# Patient Record
Sex: Female | Born: 1950 | Race: White | Hispanic: No | Marital: Married | Smoking: Never smoker
Health system: Southern US, Community
[De-identification: ages and names within clinical notes are randomized; demographics above are authoritative.]

## PROBLEM LIST (undated history)

## (undated) DIAGNOSIS — K760 Fatty (change of) liver, not elsewhere classified: Secondary | ICD-10-CM

## (undated) DIAGNOSIS — M542 Cervicalgia: Secondary | ICD-10-CM

## (undated) DIAGNOSIS — M51369 Other intervertebral disc degeneration, lumbar region without mention of lumbar back pain or lower extremity pain: Secondary | ICD-10-CM

## (undated) DIAGNOSIS — T884XXA Failed or difficult intubation, initial encounter: Secondary | ICD-10-CM

## (undated) DIAGNOSIS — J302 Other seasonal allergic rhinitis: Secondary | ICD-10-CM

## (undated) DIAGNOSIS — H409 Unspecified glaucoma: Secondary | ICD-10-CM

## (undated) DIAGNOSIS — F32A Depression, unspecified: Secondary | ICD-10-CM

## (undated) DIAGNOSIS — I10 Essential (primary) hypertension: Secondary | ICD-10-CM

## (undated) DIAGNOSIS — H25019 Cortical age-related cataract, unspecified eye: Secondary | ICD-10-CM

## (undated) DIAGNOSIS — K219 Gastro-esophageal reflux disease without esophagitis: Secondary | ICD-10-CM

## (undated) DIAGNOSIS — G4710 Hypersomnia, unspecified: Secondary | ICD-10-CM

## (undated) DIAGNOSIS — G44229 Chronic tension-type headache, not intractable: Secondary | ICD-10-CM

## (undated) DIAGNOSIS — E785 Hyperlipidemia, unspecified: Secondary | ICD-10-CM

## (undated) DIAGNOSIS — E119 Type 2 diabetes mellitus without complications: Secondary | ICD-10-CM

## (undated) DIAGNOSIS — K469 Unspecified abdominal hernia without obstruction or gangrene: Secondary | ICD-10-CM

## (undated) DIAGNOSIS — M5136 Other intervertebral disc degeneration, lumbar region: Secondary | ICD-10-CM

## (undated) DIAGNOSIS — G4730 Sleep apnea, unspecified: Secondary | ICD-10-CM

## (undated) DIAGNOSIS — C801 Malignant (primary) neoplasm, unspecified: Secondary | ICD-10-CM

## (undated) DIAGNOSIS — Z8619 Personal history of other infectious and parasitic diseases: Secondary | ICD-10-CM

## (undated) DIAGNOSIS — T4145XA Adverse effect of unspecified anesthetic, initial encounter: Secondary | ICD-10-CM

## (undated) DIAGNOSIS — T8859XA Other complications of anesthesia, initial encounter: Secondary | ICD-10-CM

## (undated) DIAGNOSIS — D649 Anemia, unspecified: Secondary | ICD-10-CM

## (undated) DIAGNOSIS — M545 Low back pain, unspecified: Secondary | ICD-10-CM

## (undated) DIAGNOSIS — M1990 Unspecified osteoarthritis, unspecified site: Secondary | ICD-10-CM

## (undated) DIAGNOSIS — I739 Peripheral vascular disease, unspecified: Secondary | ICD-10-CM

## (undated) DIAGNOSIS — F329 Major depressive disorder, single episode, unspecified: Secondary | ICD-10-CM

## (undated) DIAGNOSIS — F419 Anxiety disorder, unspecified: Secondary | ICD-10-CM

## (undated) HISTORY — PX: CARPAL TUNNEL RELEASE: SHX101

## (undated) HISTORY — PX: ESOPHAGOGASTRODUODENOSCOPY: SHX1529

## (undated) HISTORY — PX: APPENDECTOMY: SHX54

## (undated) HISTORY — PX: JOINT REPLACEMENT: SHX530

## (undated) HISTORY — PX: HERNIA REPAIR: SHX51

## (undated) HISTORY — PX: OTHER SURGICAL HISTORY: SHX169

## (undated) HISTORY — PX: CATARACT EXTRACTION, BILATERAL: SHX1313

## (undated) HISTORY — PX: COLONOSCOPY: SHX174

## (undated) HISTORY — PX: CHOLECYSTECTOMY: SHX55

## (undated) HISTORY — PX: EYE SURGERY: SHX253

---

## 2004-02-23 ENCOUNTER — Other Ambulatory Visit: Payer: Self-pay

## 2004-07-23 ENCOUNTER — Ambulatory Visit: Payer: Self-pay

## 2004-08-12 ENCOUNTER — Ambulatory Visit: Payer: Self-pay

## 2005-01-20 ENCOUNTER — Ambulatory Visit: Payer: Self-pay | Admitting: Unknown Physician Specialty

## 2005-08-06 ENCOUNTER — Other Ambulatory Visit: Payer: Self-pay

## 2005-08-08 ENCOUNTER — Ambulatory Visit: Payer: Self-pay | Admitting: Surgery

## 2005-08-11 ENCOUNTER — Ambulatory Visit: Payer: Self-pay

## 2006-06-24 ENCOUNTER — Ambulatory Visit: Payer: Self-pay | Admitting: Psychiatry

## 2006-08-20 ENCOUNTER — Ambulatory Visit: Payer: Self-pay

## 2006-11-16 ENCOUNTER — Ambulatory Visit: Payer: Self-pay | Admitting: Specialist

## 2007-03-03 ENCOUNTER — Other Ambulatory Visit: Payer: Self-pay

## 2007-03-03 ENCOUNTER — Ambulatory Visit: Payer: Self-pay | Admitting: Ophthalmology

## 2007-03-23 ENCOUNTER — Ambulatory Visit: Payer: Self-pay | Admitting: Ophthalmology

## 2007-09-01 ENCOUNTER — Ambulatory Visit: Payer: Self-pay

## 2008-09-11 ENCOUNTER — Ambulatory Visit: Payer: Self-pay

## 2009-10-16 ENCOUNTER — Ambulatory Visit: Payer: Self-pay

## 2010-12-13 ENCOUNTER — Ambulatory Visit: Payer: Self-pay | Admitting: Internal Medicine

## 2011-01-07 ENCOUNTER — Ambulatory Visit: Payer: Self-pay

## 2012-03-09 ENCOUNTER — Ambulatory Visit: Payer: Self-pay

## 2012-11-15 ENCOUNTER — Ambulatory Visit: Payer: Self-pay | Admitting: Unknown Physician Specialty

## 2012-11-17 LAB — PATHOLOGY REPORT

## 2013-06-24 ENCOUNTER — Ambulatory Visit: Payer: Self-pay | Admitting: Specialist

## 2013-12-09 DIAGNOSIS — M5416 Radiculopathy, lumbar region: Secondary | ICD-10-CM | POA: Insufficient documentation

## 2013-12-19 ENCOUNTER — Ambulatory Visit: Payer: Self-pay | Admitting: General Practice

## 2013-12-28 DIAGNOSIS — M5136 Other intervertebral disc degeneration, lumbar region: Secondary | ICD-10-CM | POA: Insufficient documentation

## 2014-02-23 ENCOUNTER — Ambulatory Visit: Payer: Self-pay

## 2014-02-23 ENCOUNTER — Emergency Department: Payer: Self-pay | Admitting: Emergency Medicine

## 2014-02-23 LAB — URINALYSIS, COMPLETE
BACTERIA: NONE SEEN
BILIRUBIN, UR: NEGATIVE
Blood: NEGATIVE
Glucose,UR: NEGATIVE mg/dL (ref 0–75)
Ketone: NEGATIVE
LEUKOCYTE ESTERASE: NEGATIVE
NITRITE: NEGATIVE
PROTEIN: NEGATIVE
Ph: 5 (ref 4.5–8.0)
RBC,UR: 1 /HPF (ref 0–5)
Specific Gravity: 1.029 (ref 1.003–1.030)
WBC UR: <1 /HPF (ref 0–5)

## 2014-02-23 LAB — COMPREHENSIVE METABOLIC PANEL
AST: 31 U/L (ref 15–37)
Albumin: 3.4 g/dL (ref 3.4–5.0)
Alkaline Phosphatase: 70 U/L
Anion Gap: 7 (ref 7–16)
BILIRUBIN TOTAL: 0.4 mg/dL (ref 0.2–1.0)
BUN: 14 mg/dL (ref 7–18)
Calcium, Total: 8.6 mg/dL (ref 8.5–10.1)
Chloride: 105 mmol/L (ref 98–107)
Co2: 28 mmol/L (ref 21–32)
Creatinine: 0.8 mg/dL (ref 0.60–1.30)
EGFR (African American): >60
EGFR (Non-African Amer.): >60
GLUCOSE: 141 mg/dL — AB (ref 65–99)
OSMOLALITY: 282 (ref 275–301)
Potassium: 4.1 mmol/L (ref 3.5–5.1)
SGPT (ALT): 40 U/L
Sodium: 140 mmol/L (ref 136–145)
Total Protein: 7.1 g/dL (ref 6.4–8.2)

## 2014-02-23 LAB — CBC
HCT: 40.3 % (ref 35.0–47.0)
HGB: 13.1 g/dL (ref 12.0–16.0)
MCH: 28.8 pg (ref 26.0–34.0)
MCHC: 32.6 g/dL (ref 32.0–36.0)
MCV: 88 fL (ref 80–100)
Platelet: 131 10*3/uL — ABNORMAL LOW (ref 150–440)
RBC: 4.55 10*6/uL (ref 3.80–5.20)
RDW: 13.7 % (ref 11.5–14.5)
WBC: 4.7 10*3/uL (ref 3.6–11.0)

## 2014-02-23 LAB — CK TOTAL AND CKMB (NOT AT ARMC)
CK, Total: 117 U/L
CK-MB: 3.8 ng/mL — ABNORMAL HIGH (ref 0.5–3.6)

## 2014-02-23 LAB — TROPONIN I
Troponin-I: <0.02 ng/mL
Troponin-I: <0.02 ng/mL

## 2014-03-15 ENCOUNTER — Ambulatory Visit: Payer: Self-pay | Admitting: General Practice

## 2014-03-15 LAB — CBC
HCT: 41.3 % (ref 35.0–47.0)
HGB: 13.1 g/dL (ref 12.0–16.0)
MCH: 28.1 pg (ref 26.0–34.0)
MCHC: 31.7 g/dL — ABNORMAL LOW (ref 32.0–36.0)
MCV: 89 fL (ref 80–100)
Platelet: 184 10*3/uL (ref 150–440)
RBC: 4.67 10*6/uL (ref 3.80–5.20)
RDW: 13.6 % (ref 11.5–14.5)
WBC: 5.5 10*3/uL (ref 3.6–11.0)

## 2014-03-15 LAB — URINALYSIS, COMPLETE
BLOOD: NEGATIVE
Bacteria: NONE SEEN
Bilirubin,UR: NEGATIVE
Glucose,UR: NEGATIVE mg/dL (ref 0–75)
Ketone: NEGATIVE
Leukocyte Esterase: NEGATIVE
Nitrite: NEGATIVE
PH: 5 (ref 4.5–8.0)
PROTEIN: NEGATIVE
RBC,UR: 1 /HPF (ref 0–5)
SPECIFIC GRAVITY: 1.019 (ref 1.003–1.030)
Squamous Epithelial: <1
WBC UR: 1 /HPF (ref 0–5)

## 2014-03-15 LAB — BASIC METABOLIC PANEL
ANION GAP: 6 — AB (ref 7–16)
BUN: 14 mg/dL (ref 7–18)
CALCIUM: 9 mg/dL (ref 8.5–10.1)
CREATININE: 0.69 mg/dL (ref 0.60–1.30)
Chloride: 103 mmol/L (ref 98–107)
Co2: 32 mmol/L (ref 21–32)
EGFR (African American): >60
EGFR (Non-African Amer.): >60
Glucose: 87 mg/dL (ref 65–99)
OSMOLALITY: 281 (ref 275–301)
POTASSIUM: 3.6 mmol/L (ref 3.5–5.1)
SODIUM: 141 mmol/L (ref 136–145)

## 2014-03-15 LAB — PROTIME-INR
INR: 1
Prothrombin Time: 13.5 secs (ref 11.5–14.7)

## 2014-03-15 LAB — SEDIMENTATION RATE: Erythrocyte Sed Rate: 11 mm/hr (ref 0–30)

## 2014-03-15 LAB — APTT: Activated PTT: 24.4 secs (ref 23.6–35.9)

## 2014-03-15 LAB — MRSA PCR SCREENING

## 2014-03-17 LAB — URINE CULTURE

## 2014-03-29 ENCOUNTER — Inpatient Hospital Stay: Payer: Self-pay | Admitting: General Practice

## 2014-03-30 LAB — BASIC METABOLIC PANEL
Anion Gap: 6 — ABNORMAL LOW (ref 7–16)
BUN: 9 mg/dL (ref 7–18)
CHLORIDE: 108 mmol/L — AB (ref 98–107)
CO2: 26 mmol/L (ref 21–32)
CREATININE: 0.64 mg/dL (ref 0.60–1.30)
Calcium, Total: 7.6 mg/dL — ABNORMAL LOW (ref 8.5–10.1)
EGFR (African American): >60
EGFR (Non-African Amer.): >60
Glucose: 130 mg/dL — ABNORMAL HIGH (ref 65–99)
Osmolality: 280 (ref 275–301)
POTASSIUM: 3.7 mmol/L (ref 3.5–5.1)
SODIUM: 140 mmol/L (ref 136–145)

## 2014-03-30 LAB — PLATELET COUNT: PLATELETS: 138 10*3/uL — AB (ref 150–440)

## 2014-03-30 LAB — HEMOGLOBIN: HGB: 10.5 g/dL — ABNORMAL LOW (ref 12.0–16.0)

## 2014-03-31 LAB — BASIC METABOLIC PANEL
ANION GAP: 8 (ref 7–16)
BUN: 7 mg/dL (ref 7–18)
CO2: 28 mmol/L (ref 21–32)
CREATININE: 0.55 mg/dL — AB (ref 0.60–1.30)
Calcium, Total: 7.7 mg/dL — ABNORMAL LOW (ref 8.5–10.1)
Chloride: 104 mmol/L (ref 98–107)
EGFR (Non-African Amer.): >60
GLUCOSE: 54 mg/dL — AB (ref 65–99)
Osmolality: 275 (ref 275–301)
Potassium: 3.5 mmol/L (ref 3.5–5.1)
SODIUM: 140 mmol/L (ref 136–145)

## 2014-03-31 LAB — PLATELET COUNT: PLATELETS: 141 10*3/uL — AB (ref 150–440)

## 2014-03-31 LAB — URINALYSIS, COMPLETE
BACTERIA: NONE SEEN
Bilirubin,UR: NEGATIVE
Blood: NEGATIVE
GLUCOSE, UR: NEGATIVE mg/dL (ref 0–75)
Ketone: NEGATIVE
Leukocyte Esterase: NEGATIVE
NITRITE: NEGATIVE
Ph: 6 (ref 4.5–8.0)
Protein: NEGATIVE
Specific Gravity: 1.004 (ref 1.003–1.030)

## 2014-03-31 LAB — HEMOGLOBIN: HGB: 10.2 g/dL — ABNORMAL LOW (ref 12.0–16.0)

## 2014-04-01 LAB — CBC WITH DIFFERENTIAL/PLATELET
Basophil #: 0 10*3/uL (ref 0.0–0.1)
Basophil %: 0.5 %
Eosinophil #: 0.1 10*3/uL (ref 0.0–0.7)
Eosinophil %: 1.5 %
HCT: 31.1 % — AB (ref 35.0–47.0)
HGB: 10.4 g/dL — AB (ref 12.0–16.0)
LYMPHS ABS: 1 10*3/uL (ref 1.0–3.6)
Lymphocyte %: 12.4 %
MCH: 29.4 pg (ref 26.0–34.0)
MCHC: 33.5 g/dL (ref 32.0–36.0)
MCV: 88 fL (ref 80–100)
Monocyte #: 0.8 x10 3/mm (ref 0.2–0.9)
Monocyte %: 10.1 %
NEUTROS ABS: 6.1 10*3/uL (ref 1.4–6.5)
Neutrophil %: 75.5 %
PLATELETS: 150 10*3/uL (ref 150–440)
RBC: 3.55 10*6/uL — ABNORMAL LOW (ref 3.80–5.20)
RDW: 13.5 % (ref 11.5–14.5)
WBC: 8.1 10*3/uL (ref 3.6–11.0)

## 2014-04-02 LAB — URINE CULTURE

## 2014-05-01 ENCOUNTER — Ambulatory Visit: Payer: Self-pay

## 2014-09-21 DIAGNOSIS — I10 Essential (primary) hypertension: Secondary | ICD-10-CM | POA: Insufficient documentation

## 2014-09-23 NOTE — Op Note (Signed)
PATIENT NAME:  Monica Rice, Monica Rice MR#:  [id number] DATE OF BIRTH:  1951/03/11  DATE OF PROCEDURE:  03/29/2014  PREOPERATIVE DIAGNOSIS: Degenerative arthrosis of the right hip.   POSTOPERATIVE DIAGNOSIS: Degenerative arthrosis of the right hip.   PROCEDURE PERFORMED: Right total hip arthroplasty.   SURGEON: Skip Estimable, MD   ASSISTANT: Vance Peper, PA (required to maintain retraction throughout the procedure).   ANESTHESIA: Spinal.   ESTIMATED BLOOD LOSS: 280 mL.   FLUIDS REPLACED: Crystalloid 2900 mL.   DRAINS: Two medium drains to Hemovac reservoir.   IMPLANTS UTILIZED: DePuy 12 mm small stature AML femoral stem, 50 mm outer diameter Pinnacle 100 acetabular component, +4 mm neutral Pinnacle Marathon polyethylene liner, and a 32 mm cobalt chrome hip ball with a +5 mm neck length.   INDICATIONS FOR SURGERY: The patient is a 64 year old female who has been seen for complaints of progressive right hip and groin pain as well as decreased hip range of motion. X-rays demonstrated significant degenerative changes. After discussion of the risks and benefits of surgical intervention, the patient expressed understanding of the risks and benefits, and agreed with plans for surgical intervention.   PROCEDURE IN DETAIL: The patient was brought into the Operating Room and, after adequate spinal anesthesia was achieved, the patient was placed in the left lateral decubitus position. Axillary roll was placed and all bony prominences were well padded. The patient's right hip and leg were cleaned and prepped with alcohol and DuraPrep, draped in the usual sterile fashion. A "timeout" was performed as per usual protocol. A lateral curvilinear incision was made gently curving towards the posterior superior iliac spine. IT band was incised in line with the skin incisions. Fibers of the gluteus maximus were split in line. Piriformis tendon was identified, skeletonized, incised at its insertion in the proximal femur  and reflected posteriorly. In a similar fashion, the short external rotators were incised and reflected posteriorly. A T-type posterior capsulotomy was performed. Prior to dislocation of the femoral head, a threaded Steinmann pin was inserted through a separate stab incision into the pelvis superior to the acetabulum and then bent in the form of a stylus so as to assess limb length and hip offset throughout the procedure. The femoral head was then dislocated posteriorly. Inspection of head demonstrated degenerative changes, most notably to the superior aspect. Femoral neck cut was performed. The anterior capsule was elevated off the femoral neck. The soft tissue was felt to be tight and it was elected to incise the gluteal sling for better mobilization of the proximal femur. Inspection of the acetabulum also demonstrated significant degenerative changes. The labrum was excised. The acetabulum was reamed in a sequential fashion up to a 49 mm diameter. Good punctate bleeding bone was encountered. A 50 mm outer diameter Pinnacle 100 acetabular component was positioned and impacted into place. Good scratch fit was appreciated. A +4 neutral polyethylene trial was inserted and attention was directed to the proximal femur. Pilot hole for reaming of the proximal femoral canal was created using a high-speed bur. Proximal femoral canal was reamed in a sequential fashion up to an 11.5 mm diameter. This allowed for approximately 6.5 to 7 cm of scratch fit. The proximal femur was prepared using a 12 mm aggressive side-biting reamer. Serial broaches were inserted up to a 12 mm small stature AML broach. The calcar region was planed and trial reduction was performed using first a +1 and subsequently a +5 mm neck length. This allowed for good equalization of limb  lengths and excellent stability both anteriorly and posteriorly. Good hip offset was appreciated. The trial components were removed. The acetabular shell was irrigated with  normal saline with antibiotic solution and then suctioned dry. A +4 mm neutral Pinnacle Marathon polyethylene insert was positioned and impacted into place. Next, a 12 mm small stature AML femoral component was positioned and impacted into place. Excellent scratch fit was appreciated. Trial reduction was again performed with a 32 mm hip ball with A +5 mm neck length. Again, good equalization of limb lengths was appreciated and good hip offset was noted. Excellent stability was appreciated both anteriorly and posteriorly. Trial head was removed. The Morse taper was cleaned and dried. A 32 mm cobalt chrome hip ball with a +5 mm neck length was placed on the trunnion and impacted into place. The hip was then reduced and placed through a range of motion with excellent stability appreciated. Good equalization of limb lengths was appreciated and appropriate hip offset noted. The wound was irrigated with copious amounts of normal saline with antibiotic solution using pulsatile lavage and then suctioned dry. Good hemostasis was appreciated. The posterior capsule was repaired using #5 Ethibond. The piriformis tendon was reapproximated on the surface of the gluteus medius tendon using #5 Ethibond. Gluteal sling was also repaired using interrupted sutures of #5 Ethibond. Two medium drains were placed in the wound bed and brought out through a separate stab incision to be attached to a Hemovac reservoir. IT band was repaired using interrupted sutures of #1 Vicryl. The subcutaneous tissue was approximated in layers using first #0 Vicryl followed by 2-0 Vicryl. Skin was closed with skin staples. A sterile dressing was applied.   The patient tolerated the procedure well. She was transported to the recovery room in stable condition.    ____________________________ Laurice Record. Holley Bouche., MD jph:TT D: 03/29/2014 11:18:29 ET T: 03/29/2014 19:36:43 ET JOB#: [id number]  cc: Laurice Record. Holley Bouche., MD, <Dictator> JAMES P Holley Bouche  MD ELECTRONICALLY SIGNED 03/31/2014 9:55

## 2014-09-23 NOTE — Discharge Summary (Signed)
PATIENT NAME:  Monica Rice, NEWGENT MR#:  [id number] DATE OF BIRTH:  Feb 13, 1951  DATE OF ADMISSION:  03/29/2014 DATE OF DISCHARGE:  04/01/2014  ADMITTING DIAGNOSIS: Degenerative arthrosis of the right hip.   DISCHARGE DIAGNOSES: Degenerative arthrosis of the right hip.   OPERATION: On 03/29/2014, the patient had right total hip arthroplasty.   SURGEON: Skip Estimable, M.D.   ASSISTANT: Vance Peper, PA.  ANESTHESIA: Spinal.   ESTIMATED BLOOD LOSS: 280 mL.  IMPLANTS USED: DePuy 12 mm small stature AML femoral stem, 50 mm outer diameter Pinnacle 100 acetabular component, +4 mm neutral Pinnacle Marathon polyethylene liner, 32 mm cobalt chrome hip ball with +5 neck length.   The patient was stabilized, brought down to the recovery room, and then brought down to the orthopedic floor.   HISTORY: The patient is a 64 year old female who presented for persistent pain involving the right hip. The patient has been refractory to conservative treatment with activity modification and ambulatory aids. The patient has had no improvement. It affected her activities of daily living.   PHYSICAL EXAMINATION: GENERAL: Alert female with an antalgic gait, no acute distress.  LUNGS: Clear to auscultation.  CARDIOVASCULAR: Regular rate and rhythm with no murmur.  MUSCULOSKELETAL: In regard to the right hip, the patient has no real soft tissue swelling, although there is mild tenderness laterally and anteriorly. The patient has discomfort with axial compression and extreme range of motion. The patient has full extension at 110 degrees flexion with 10 degrees internal rotation and 30 degrees external rotation.   HOSPITAL COURSE: After initial admission on March 29, 2014, the patient was brought to the orthopedic floor. On postop day 1, the patient had a hemoglobin of 10.5, which dropped down to 10.2 on postop day 2 with no transfusions given. The patient worked with physical therapy, initially ambulating 40 feet  around the room and then progressing to around the nurse's station. The patient was ready to go home with home health physical therapy on April 01, 2014.   CONDITION AT DISCHARGE: Stable.   DISPOSITION: The patient was sent home with home health physical therapy.   DISCHARGE INSTRUCTIONS: The patient will follow-up with [facility] orthopedics in 6 weeks for an appointment with Dr. Marry Guan. The patient will do weight bear as tolerated on the effected leg. The patient will use knee-high TED hose on both legs, removed at bedtime, and elevate her heels off the bed as well as use the incentive spirometer and be encouraged to do cough and deep breathing. The patient is to do a diabetic diet. The patient will keep her dressing clean and dry and try not to get it wet. The patient will have a dressing change on a p.r.n. basis with physical therapy. The patient will call the clinic if there is any bright red bleeding, calf pain, bowel or bladder difficulty, or any fever greater than 101.5. The patient will do home health physical therapy working on gait training and strengthening as well as range of motion.   DISCHARGE MEDICATIONS: The patient is to resume home medications and to add oxycodone 5 mg 1 tablet q. 4 hours as needed for moderate to severe pain, tramadol 50 mg 1 tablet q. 6 hours as needed for less severe pain, Lovenox 40 mg once a day for 14 days and then discontinue and begin aspirin 81 mg once a day and Tylenol 1 tablet as needed for fever or pain. ____________________________ Lenna Sciara. Reche Dixon, [place] jtm:sb D: 03/31/2014 07:15:21 ET T: 03/31/2014 09:18:02  ET JOB#: [id number]  cc: J. Reche Dixon, [place], <Dictator> J [facility] PA ELECTRONICALLY SIGNED 04/06/2014 14:04

## 2014-09-25 LAB — SURGICAL PATHOLOGY

## 2014-10-04 DIAGNOSIS — I6523 Occlusion and stenosis of bilateral carotid arteries: Secondary | ICD-10-CM | POA: Insufficient documentation

## 2014-11-02 DIAGNOSIS — M5442 Lumbago with sciatica, left side: Secondary | ICD-10-CM

## 2014-11-02 DIAGNOSIS — G8929 Other chronic pain: Secondary | ICD-10-CM | POA: Insufficient documentation

## 2014-11-02 DIAGNOSIS — M5441 Lumbago with sciatica, right side: Secondary | ICD-10-CM

## 2015-04-02 ENCOUNTER — Other Ambulatory Visit: Payer: Self-pay | Admitting: Internal Medicine

## 2015-04-02 ENCOUNTER — Other Ambulatory Visit: Payer: Self-pay | Admitting: Unknown Physician Specialty

## 2015-04-02 DIAGNOSIS — Z1231 Encounter for screening mammogram for malignant neoplasm of breast: Secondary | ICD-10-CM

## 2015-05-03 ENCOUNTER — Other Ambulatory Visit: Payer: Self-pay | Admitting: Internal Medicine

## 2015-05-03 ENCOUNTER — Ambulatory Visit
Admission: RE | Admit: 2015-05-03 | Discharge: 2015-05-03 | Disposition: A | Discharge status: Home / Self Care | Payer: Medicare Other | Source: Ambulatory Visit | Attending: Internal Medicine | Admitting: Internal Medicine

## 2015-05-03 DIAGNOSIS — Z1231 Encounter for screening mammogram for malignant neoplasm of breast: Secondary | ICD-10-CM | POA: Diagnosis present

## 2015-06-26 DIAGNOSIS — Z96641 Presence of right artificial hip joint: Secondary | ICD-10-CM | POA: Insufficient documentation

## 2015-07-02 IMAGING — CR RIGHT HIP - COMPLETE 2+ VIEW
1 series · 2 of 2 positions shown · non-contrast
Comparison: None.

CLINICAL DATA: Postop right total hip arthroplasty.

EXAM:
RIGHT HIP - COMPLETE 2+ VIEW

[Series 1: ap · 0.17mm/px · 2 of 2 slices shown]
[im 1/2]
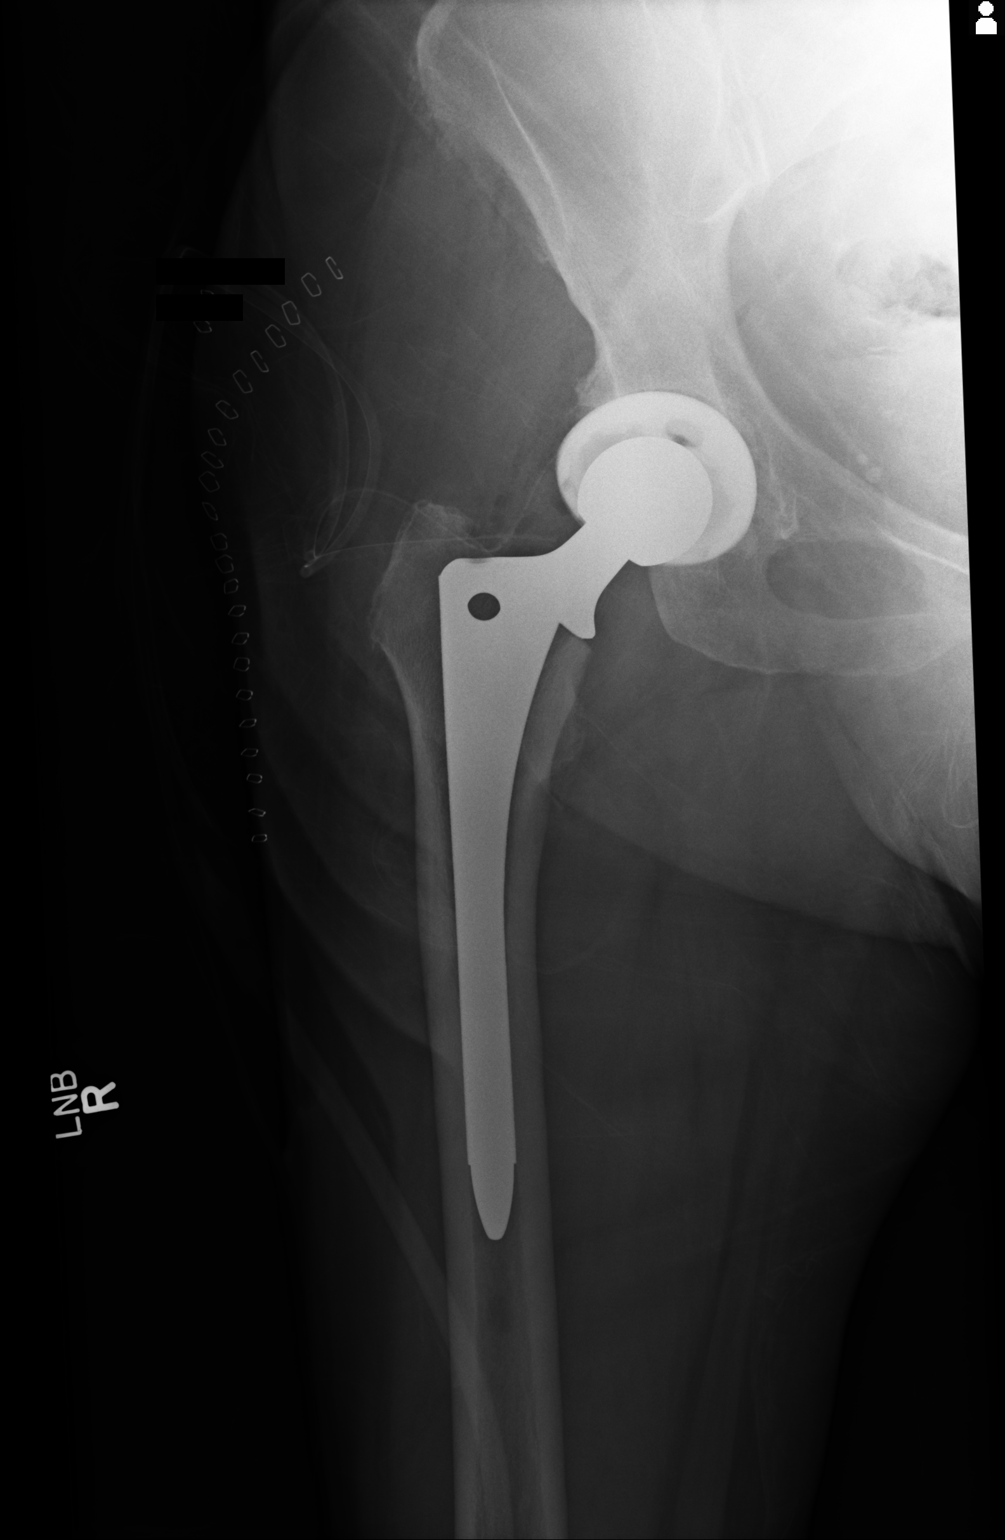
[im 2/2]
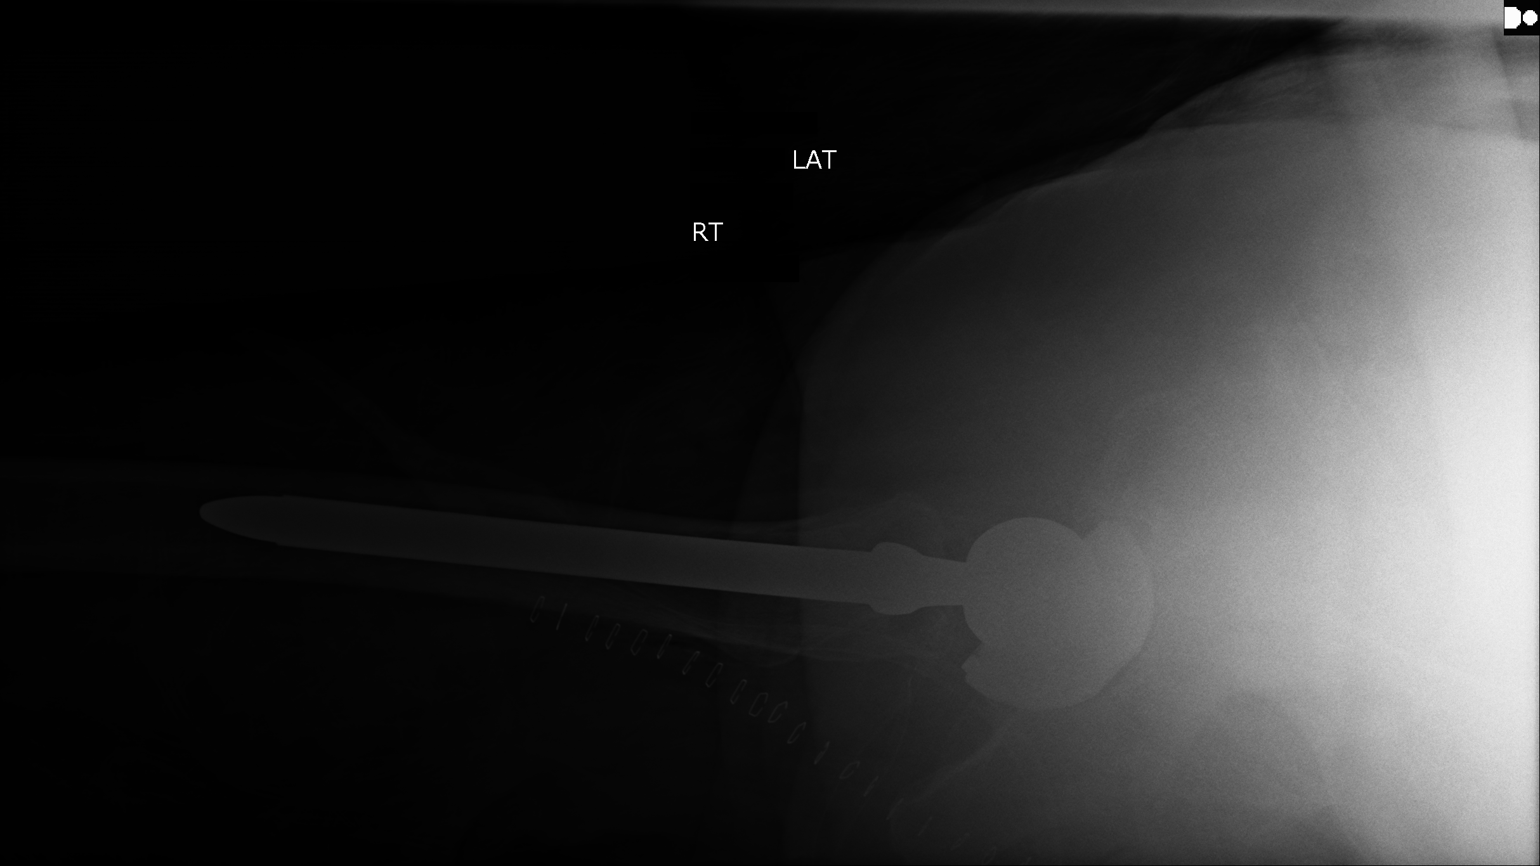

[2 of 2 positions shown; findings below may reference images not displayed]

FINDINGS: The femoral and acetabular components appear well seated. No
complicating features.
IMPRESSION: Well seated components of a total right hip arthroplasty.

## 2016-01-24 ENCOUNTER — Emergency Department: Payer: Medicare Other

## 2016-01-24 ENCOUNTER — Inpatient Hospital Stay
Admission: EM | Admit: 2016-01-24 | Discharge: 2016-01-29 | DRG: 330 | Disposition: A | Discharge status: Home / Self Care | Payer: Medicare Other | Attending: Surgery | Admitting: Surgery

## 2016-01-24 ENCOUNTER — Encounter: Payer: Self-pay | Admitting: Emergency Medicine

## 2016-01-24 DIAGNOSIS — Q430 Meckel's diverticulum (displaced) (hypertrophic): Secondary | ICD-10-CM | POA: Diagnosis present

## 2016-01-24 DIAGNOSIS — K660 Peritoneal adhesions (postprocedural) (postinfection): Secondary | ICD-10-CM | POA: Diagnosis present

## 2016-01-24 DIAGNOSIS — K5710 Diverticulosis of small intestine without perforation or abscess without bleeding: Secondary | ICD-10-CM

## 2016-01-24 DIAGNOSIS — Z7982 Long term (current) use of aspirin: Secondary | ICD-10-CM | POA: Diagnosis not present

## 2016-01-24 DIAGNOSIS — Z79899 Other long term (current) drug therapy: Secondary | ICD-10-CM

## 2016-01-24 DIAGNOSIS — I10 Essential (primary) hypertension: Secondary | ICD-10-CM | POA: Diagnosis present

## 2016-01-24 DIAGNOSIS — E119 Type 2 diabetes mellitus without complications: Secondary | ICD-10-CM | POA: Diagnosis not present

## 2016-01-24 DIAGNOSIS — Z794 Long term (current) use of insulin: Secondary | ICD-10-CM | POA: Diagnosis not present

## 2016-01-24 DIAGNOSIS — K5700 Diverticulitis of small intestine with perforation and abscess without bleeding: Secondary | ICD-10-CM | POA: Diagnosis present

## 2016-01-24 DIAGNOSIS — Z5331 Laparoscopic surgical procedure converted to open procedure: Secondary | ICD-10-CM | POA: Diagnosis not present

## 2016-01-24 DIAGNOSIS — K5712 Diverticulitis of small intestine without perforation or abscess without bleeding: Secondary | ICD-10-CM | POA: Diagnosis present

## 2016-01-24 DIAGNOSIS — Z23 Encounter for immunization: Secondary | ICD-10-CM

## 2016-01-24 HISTORY — DX: Failed or difficult intubation, initial encounter: T88.4XXA

## 2016-01-24 HISTORY — DX: Essential (primary) hypertension: I10

## 2016-01-24 HISTORY — DX: Type 2 diabetes mellitus without complications: E11.9

## 2016-01-24 LAB — URINALYSIS COMPLETE WITH MICROSCOPIC (ARMC ONLY)
BACTERIA UA: NONE SEEN
BILIRUBIN URINE: NEGATIVE
GLUCOSE, UA: NEGATIVE mg/dL
HGB URINE DIPSTICK: NEGATIVE
Ketones, ur: NEGATIVE mg/dL
Leukocytes, UA: NEGATIVE
NITRITE: NEGATIVE
Protein, ur: NEGATIVE mg/dL
Specific Gravity, Urine: 1.02 (ref 1.005–1.030)
pH: 7 (ref 5.0–8.0)

## 2016-01-24 LAB — COMPREHENSIVE METABOLIC PANEL
ALK PHOS: 67 U/L (ref 38–126)
ALT: 48 U/L (ref 14–54)
AST: 42 U/L — AB (ref 15–41)
Albumin: 4.2 g/dL (ref 3.5–5.0)
Anion gap: 9 (ref 5–15)
BILIRUBIN TOTAL: 0.5 mg/dL (ref 0.3–1.2)
BUN: 15 mg/dL (ref 6–20)
CALCIUM: 9.8 mg/dL (ref 8.9–10.3)
CO2: 30 mmol/L (ref 22–32)
Chloride: 98 mmol/L — ABNORMAL LOW (ref 101–111)
Creatinine, Ser: 0.5 mg/dL (ref 0.44–1.00)
GFR calc Af Amer: >60 mL/min (ref >60)
GFR calc non Af Amer: >60 mL/min (ref >60)
GLUCOSE: 141 mg/dL — AB (ref 65–99)
POTASSIUM: 4.2 mmol/L (ref 3.5–5.1)
SODIUM: 137 mmol/L (ref 135–145)
TOTAL PROTEIN: 7.5 g/dL (ref 6.5–8.1)

## 2016-01-24 LAB — CBC
HEMATOCRIT: 42.1 % (ref 35.0–47.0)
HEMOGLOBIN: 14.1 g/dL (ref 12.0–16.0)
MCH: 29.7 pg (ref 26.0–34.0)
MCHC: 33.6 g/dL (ref 32.0–36.0)
MCV: 88.5 fL (ref 80.0–100.0)
Platelets: 173 10*3/uL (ref 150–440)
RBC: 4.75 MIL/uL (ref 3.80–5.20)
RDW: 13.9 % (ref 11.5–14.5)
WBC: 12.8 10*3/uL — ABNORMAL HIGH (ref 3.6–11.0)

## 2016-01-24 LAB — LIPASE, BLOOD: Lipase: 19 U/L (ref 11–51)

## 2016-01-24 MED ORDER — HYDROCODONE-ACETAMINOPHEN 5-325 MG PO TABS
1.0000 | ORAL_TABLET | ORAL | Status: DC | PRN
  Administered 2016-01-25: 1 via ORAL
  Administered 2016-01-25 (×3): 2 via ORAL
  Administered 2016-01-26 (×2): 1 via ORAL
  Filled 2016-01-24: qty 2
  Filled 2016-01-24 (×3): qty 1
  Filled 2016-01-24 (×2): qty 2

## 2016-01-24 MED ORDER — MORPHINE SULFATE (PF) 4 MG/ML IV SOLN
4.0000 mg | Freq: Once | INTRAVENOUS | Status: AC
  Administered 2016-01-24: 4 mg via INTRAVENOUS
  Filled 2016-01-24: qty 1

## 2016-01-24 MED ORDER — INSULIN ASPART PROT & ASPART (70-30 MIX) 100 UNIT/ML ~~LOC~~ SUSP
20.0000 [IU] | Freq: Two times a day (BID) | SUBCUTANEOUS | Status: DC
  Administered 2016-01-25 – 2016-01-29 (×5): 20 [IU] via SUBCUTANEOUS
  Filled 2016-01-24 (×5): qty 20

## 2016-01-24 MED ORDER — HEPARIN SODIUM (PORCINE) 5000 UNIT/ML IJ SOLN
5000.0000 [IU] | Freq: Three times a day (TID) | INTRAMUSCULAR | Status: DC
  Administered 2016-01-25 – 2016-01-29 (×12): 5000 [IU] via SUBCUTANEOUS
  Filled 2016-01-24 (×12): qty 1

## 2016-01-24 MED ORDER — ONDANSETRON HCL 4 MG/2ML IJ SOLN
INTRAMUSCULAR | Status: AC
  Administered 2016-01-24: 4 mg via INTRAVENOUS
  Filled 2016-01-24: qty 2

## 2016-01-24 MED ORDER — FLUOXETINE HCL 20 MG PO CAPS
40.0000 mg | ORAL_CAPSULE | Freq: Every day | ORAL | Status: DC
  Administered 2016-01-25: 40 mg via ORAL
  Filled 2016-01-24: qty 2

## 2016-01-24 MED ORDER — AMLODIPINE BESYLATE 5 MG PO TABS: 5.0000 mg | ORAL_TABLET | Freq: Every day | ORAL | Status: DC

## 2016-01-24 MED ORDER — ONDANSETRON HCL 4 MG/2ML IJ SOLN: 4.0000 mg | Freq: Four times a day (QID) | INTRAMUSCULAR | Status: DC | PRN

## 2016-01-24 MED ORDER — ONDANSETRON HCL 4 MG PO TABS: 4.0000 mg | ORAL_TABLET | Freq: Four times a day (QID) | ORAL | Status: DC | PRN

## 2016-01-24 MED ORDER — ONDANSETRON HCL 4 MG/2ML IJ SOLN
4.0000 mg | Freq: Once | INTRAMUSCULAR | Status: AC
Start: 2016-01-24 — End: 2016-01-24
  Administered 2016-01-24: 4 mg via INTRAVENOUS

## 2016-01-24 MED ORDER — ATORVASTATIN CALCIUM 20 MG PO TABS
20.0000 mg | ORAL_TABLET | Freq: Every day | ORAL | Status: DC
  Administered 2016-01-24 – 2016-01-25 (×2): 20 mg via ORAL
  Filled 2016-01-24 (×2): qty 1

## 2016-01-24 MED ORDER — LACTATED RINGERS IV SOLN
INTRAVENOUS | Status: DC
  Administered 2016-01-24 – 2016-01-25 (×2): via INTRAVENOUS

## 2016-01-24 MED ORDER — FLUTICASONE PROPIONATE 50 MCG/ACT NA SUSP: 1.0000 | Freq: Every day | NASAL | Status: DC | PRN

## 2016-01-24 MED ORDER — PNEUMOCOCCAL VAC POLYVALENT 25 MCG/0.5ML IJ INJ
0.5000 mL | INJECTION | INTRAMUSCULAR | Status: AC
  Administered 2016-01-26: 0.5 mL via INTRAMUSCULAR
  Filled 2016-01-24: qty 0.5

## 2016-01-24 MED ORDER — METFORMIN HCL 500 MG PO TABS
1000.0000 mg | ORAL_TABLET | Freq: Two times a day (BID) | ORAL | Status: DC
  Administered 2016-01-24 – 2016-01-25 (×2): 1000 mg via ORAL
  Filled 2016-01-24 (×2): qty 2

## 2016-01-24 MED ORDER — IOPAMIDOL (ISOVUE-300) INJECTION 61%
100.0000 mL | Freq: Once | INTRAVENOUS | Status: AC | PRN
  Administered 2016-01-24: 100 mL via INTRAVENOUS

## 2016-01-24 MED ORDER — PIPERACILLIN-TAZOBACTAM 3.375 G IVPB 30 MIN
3.3750 g | Freq: Three times a day (TID) | INTRAVENOUS | Status: DC
  Administered 2016-01-25 – 2016-01-27 (×7): 3.375 g via INTRAVENOUS
  Filled 2016-01-24 (×9): qty 50

## 2016-01-24 MED ORDER — FAMOTIDINE IN NACL 20-0.9 MG/50ML-% IV SOLN
20.0000 mg | Freq: Two times a day (BID) | INTRAVENOUS | Status: DC
  Administered 2016-01-25: 20 mg via INTRAVENOUS
  Filled 2016-01-24 (×3): qty 50

## 2016-01-24 MED ORDER — SODIUM CHLORIDE 0.9 % IV BOLUS (SEPSIS)
1000.0000 mL | Freq: Once | INTRAVENOUS | Status: AC
  Administered 2016-01-24: 1000 mL via INTRAVENOUS

## 2016-01-24 MED ORDER — DIATRIZOATE MEGLUMINE & SODIUM 66-10 % PO SOLN
15.0000 mL | Freq: Once | ORAL | Status: AC
  Administered 2016-01-24: 15 mL via ORAL

## 2016-01-24 MED ORDER — CIPROFLOXACIN IN D5W 400 MG/200ML IV SOLN
400.0000 mg | Freq: Once | INTRAVENOUS | Status: AC
  Administered 2016-01-24: 400 mg via INTRAVENOUS
  Filled 2016-01-24: qty 200

## 2016-01-24 MED ORDER — GABAPENTIN 100 MG PO CAPS
100.0000 mg | ORAL_CAPSULE | Freq: Every day | ORAL | Status: DC
  Administered 2016-01-24 – 2016-01-26 (×3): 100 mg via ORAL
  Filled 2016-01-24 (×3): qty 1

## 2016-01-24 MED ORDER — LATANOPROST 0.005 % OP SOLN
1.0000 [drp] | Freq: Every day | OPHTHALMIC | Status: DC
  Administered 2016-01-25 – 2016-01-29 (×6): 1 [drp] via OPHTHALMIC
  Filled 2016-01-24: qty 2.5

## 2016-01-24 MED ORDER — METRONIDAZOLE IN NACL 5-0.79 MG/ML-% IV SOLN
500.0000 mg | Freq: Once | INTRAVENOUS | Status: AC
  Administered 2016-01-24: 500 mg via INTRAVENOUS
  Filled 2016-01-24 (×2): qty 100

## 2016-01-24 MED ORDER — ASPIRIN EC 81 MG PO TBEC
81.0000 mg | DELAYED_RELEASE_TABLET | Freq: Every day | ORAL | Status: DC
  Administered 2016-01-24: 81 mg via ORAL
  Filled 2016-01-24: qty 1

## 2016-01-24 NOTE — ED Notes (Signed)
Dr. Copper MD at bedside.

## 2016-01-24 NOTE — ED Notes (Signed)
Patient transported to CT 

## 2016-01-24 NOTE — H&P (Signed)
Monica Rice is an 65 y.o. female.    Chief Complaint: Right side abdominal pain  HPI: This patient who has right-sided abdominal pain she points to the right lower quadrant and moves her hand up to the right flank stating that it is migrated to the right flank has worsened throughout the day she states that it started this morning but on further questioning she had some pain last night but the bulk of it was this morning and worsening through out the day. She's had some nausea but no emesis had a normal bowel movement yesterday no melena no hematochezia no fevers or chills she never had an episode like this before she had a colonoscopy 3 years ago by Dr. Vira Agar. Patient has had an appendectomy family history is noncontributory Past Medical History:  Diagnosis Date  . Diabetes mellitus without complication ([place])   . Hypertension     Past Surgical History:  Procedure Laterality Date  . APPENDECTOMY    . CHOLECYSTECTOMY      No family history on file. Social History:  reports that she has never smoked. She has never used smokeless tobacco. Her alcohol and drug histories are not on file.  Allergies:  Allergies  Allergen Reactions  . Neosporin [Neomycin-Bacitracin Zn-Polymyx] Rash  . Sulfa Antibiotics Rash     (Not in a hospital admission)   Review of Systems  Constitutional: Negative for chills, fever and weight loss.  HENT: Negative.   Eyes: Negative.   Respiratory: Negative.   Cardiovascular: Negative.   Gastrointestinal: Positive for abdominal pain and nausea. Negative for blood in stool, constipation, diarrhea, heartburn, melena and vomiting.  Genitourinary: Negative.   Musculoskeletal: Negative.   Skin: Negative.   Neurological: Negative.   Endo/Heme/Allergies: Negative.   Psychiatric/Behavioral: Negative.      Physical Exam:  BP 123/77   Pulse 84   Temp 98.6 F (37 C) (Oral)   Resp 18   Ht '5\' 6"'  (1.676 m)   Wt 172 lb (78 kg)   SpO2 93%   BMI  27.76 kg/m   Physical Exam  Constitutional: She is oriented to person, place, and time and well-developed, well-nourished, and in no distress. No distress.  Comfortable-appearing, she moves about the bed without difficulty  HENT:  Head: Normocephalic and atraumatic.  Eyes: Right eye exhibits no discharge. Left eye exhibits no discharge. No scleral icterus.  Neck: Normal range of motion.  Cardiovascular: Normal rate, regular rhythm and normal heart sounds.   Pulmonary/Chest: Effort normal and breath sounds normal. No respiratory distress. She has no wheezes. She has no rales.  Abdominal: Soft. She exhibits no distension and no mass. There is tenderness. There is no rebound and no guarding.  Minimal tenderness maximal in the right lower quadrant without guarding no rebound no percussion tenderness and a negative Rovsing sign hours scars are noted  Musculoskeletal: Normal range of motion. She exhibits no edema or tenderness.  Lymphadenopathy:    She has no cervical adenopathy.  Neurological: She is alert and oriented to person, place, and time.  Skin: Skin is warm and dry. No rash noted. She is not diaphoretic. No erythema.  Psychiatric: Mood and affect normal.        Results for orders placed or performed during the hospital encounter of 01/24/16 (from the past 48 hour(s))  Lipase, blood     Status: None   Collection Time: 01/24/16  4:59 PM  Result Value Ref Range   Lipase 19 11 - 51 U/L  Comprehensive metabolic panel     Status: Abnormal   Collection Time: 01/24/16  4:59 PM  Result Value Ref Range   Sodium 137 135 - 145 mmol/L   Potassium 4.2 3.5 - 5.1 mmol/L   Chloride 98 (L) 101 - 111 mmol/L   CO2 30 22 - 32 mmol/L   Glucose, Bld 141 (H) 65 - 99 mg/dL   BUN 15 6 - 20 mg/dL   Creatinine, Ser 0.50 0.44 - 1.00 mg/dL   Calcium 9.8 8.9 - 10.3 mg/dL   Total Protein 7.5 6.5 - 8.1 g/dL   Albumin 4.2 3.5 - 5.0 g/dL   AST 42 (H) 15 - 41 U/L   ALT 48 14 - 54 U/L   Alkaline  Phosphatase 67 38 - 126 U/L   Total Bilirubin 0.5 0.3 - 1.2 mg/dL   GFR calc non Af Amer >60 >60 mL/min   GFR calc Af Amer >60 >60 mL/min    Comment: (NOTE) The eGFR has been calculated using the CKD EPI equation. This calculation has not been validated in all clinical situations. eGFR's persistently <60 mL/min signify possible Chronic Kidney Disease.    Anion gap 9 5 - 15  CBC     Status: Abnormal   Collection Time: 01/24/16  4:59 PM  Result Value Ref Range   WBC 12.8 (H) 3.6 - 11.0 K/uL   RBC 4.75 3.80 - 5.20 MIL/uL   Hemoglobin 14.1 12.0 - 16.0 g/dL   HCT 42.1 35.0 - 47.0 %   MCV 88.5 80.0 - 100.0 fL   MCH 29.7 26.0 - 34.0 pg   MCHC 33.6 32.0 - 36.0 g/dL   RDW 13.9 11.5 - 14.5 %   Platelets 173 150 - 440 K/uL  Urinalysis complete, with microscopic     Status: Abnormal   Collection Time: 01/24/16  4:59 PM  Result Value Ref Range   Color, Urine YELLOW (A) YELLOW   APPearance CLEAR (A) CLEAR   Glucose, UA NEGATIVE NEGATIVE mg/dL   Bilirubin Urine NEGATIVE NEGATIVE   Ketones, ur NEGATIVE NEGATIVE mg/dL   Specific Gravity, Urine 1.020 1.005 - 1.030   Hgb urine dipstick NEGATIVE NEGATIVE   pH 7.0 5.0 - 8.0   Protein, ur NEGATIVE NEGATIVE mg/dL   Nitrite NEGATIVE NEGATIVE   Leukocytes, UA NEGATIVE NEGATIVE   RBC / HPF 0-5 0 - 5 RBC/hpf   WBC, UA 0-5 0 - 5 WBC/hpf   Bacteria, UA NONE SEEN NONE SEEN   Squamous Epithelial / LPF 0-5 (A) NONE SEEN   Ct Abdomen Pelvis W Contrast  Result Date: 01/24/2016 CLINICAL DATA:  Right lower quadrant abdominal pain. Reported rebound tenderness. EXAM: CT ABDOMEN AND PELVIS WITH CONTRAST TECHNIQUE: Multidetector CT imaging of the abdomen and pelvis was performed using the standard protocol following bolus administration of intravenous contrast. CONTRAST:  174m ISOVUE-300 IOPAMIDOL (ISOVUE-300) INJECTION 61% COMPARISON:  Report abdominal ultrasound 06/24/2013 FINDINGS: Lower chest: Linear subsegmental atelectasis or scarring in the posterior  basal segments of both lower lobes. Coronary artery atherosclerosis. Small type 1 hiatal hernia. Hepatobiliary: Diffuse hepatic steatosis. Nonspecific 6 mm hypodense lesion in segment 6 medially, image 43/2. Cholecystectomy. Pancreas: Unremarkable Spleen: Unremarkable Adrenals/Urinary Tract: Adrenal glands normal. Right kidney upper pole 1.7 by 1.3 cm lesion measuring 11 Hounsfield units portal venous phase and 13 Hounsfield units delayed phase, likely a benign cyst. Left kidney upper pole exophytic 2.5 by 2.4 cm simple cyst noted. 1 mm punctate calcification in the left kidney lower pole on image 60/5  suspicious for nonobstructive nephrolithiasis. Minimal scarring in the right kidney lower pole. Several other tiny hypodense renal lesions are technically too small to characterize although statistically likely to be cysts. Part of the right distal ureter and the urinary bladder obscured by streak artifact from the right hip implant. Stomach/Bowel: There is an inflamed diverticulum type outpouching of bowel in the right abdomen at the level of the iliac crest. This is tangential to both the ascending colon and adjacent small bowel loops, and I am unsure which it is arising from, although I would probably favor this is arising from the ascending colon. There may be a small amount of contained extraluminal gas and fluid along with inflammatory stranding, for example on images 25 through 34 of series 6 and images 24 through 30 of series 5. The patient has history of appendectomy and accordingly I do not believe that this is an inflamed appendix. Vascular/Lymphatic: Aortoiliac atherosclerotic vascular disease. No adenopathy identified. Reproductive: Unremarkable Other: No supplemental non-categorized findings. Musculoskeletal: Broad midline laxity or herniation of small bowel with rectus diastases, containing omentum and some marginal cecum. No strangulation or complicating feature. Markedly severe degenerative left hip  arthropathy. Right hip implant in place. Lumbar spondylosis and degenerative disc disease causing impingement at L4-5. IMPRESSION: 1. The dominant finding is an inflamed diverticulum-like structure in the right abdomen at the level of the iliac crest anteriorly. I am entirely certain whether this is arising from the ascending colon or adjacent small bowel, although I favor the former. There may be a small contained amount of extraluminal gas and fluid associated with this inflamed diverticulum. The patient has a history of appendectomy so I do not believe this is appendicitis. Adjacent inflammatory stranding noted. 2. Other imaging findings of potential clinical significance: Small type 1 hiatal hernia. Coronary artery atherosclerosis. Aortoiliac atherosclerotic vascular disease. Diffuse hepatic steatosis. Nonspecific 6 mm hypodense lesion in segment 6 of the liver medially, technically nonspecific although statistically likely to be benign. Several renal cysts. 1 mm left kidney lower pole nonobstructive calculus. Midline high rectus diastases containing omentum and some marginal cecum without complicating feature. Markedly severe degenerative left hip arthropathy. Impingement at L4-5 due to lumbar spondylosis and degenerative disc disease. Electronically Signed   By: Van Clines M.D.   On: 01/24/2016 20:26     Assessment/Plan  CT scan has been personally reviewed and while there is no mention of this my concern is that this might be a Meckel's diverticulum with inflammation it is also unclear to me as to whether or not this is truly perforated and I will treat her with IV antibiotics and H2 blockers. I suggested that if this does not promptly improve that laparoscopy would be necessary for diagnosis and treatment and if it does improve on IV antibiotics then diagnostic laparoscopy at some point would be helpful to delineate the cause of this so that it does not happen again she her family of multiple  questions which were answered she will be admitted the hospital and I will turn this patient over 2 and discussed with Dr. Perrin Maltese tomorrow morning  Florene Glen, MD, FACS

## 2016-01-24 NOTE — ED Provider Notes (Signed)
[facility] Provider Note   CSN: [id number] Arrival date & time: 01/24/16  1641     History   Chief Complaint Chief Complaint  Patient presents with  . Flank Pain  . Abdominal Pain    HPI Monica Rice is a 65 y.o. female hx of DM, HTN, previous appendectomy and cholecystectomy here with RLQ pain. Right lower quadrant pain started this morning. Denies any radiation from the pain but is constant dull pain. She states that she is passing gas and denies any vomiting or fevers. She denies any history of small bowel obstruction after her appendectomy and cholecystectomy.   The history is provided by the patient.    Past Medical History:  Diagnosis Date  . Diabetes mellitus without complication ([place])   . Hypertension     Patient Active Problem List   Diagnosis Date Noted  . Diverticula of small intestine 01/24/2016    Past Surgical History:  Procedure Laterality Date  . APPENDECTOMY    . CHOLECYSTECTOMY      OB History    No data available       Home Medications    Prior to Admission medications   Medication Sig Start Date End Date Taking? Authorizing Provider  amLODipine (NORVASC) 5 MG tablet Take 1 tablet by mouth daily. 01/17/16  Yes Historical Provider, MD  aspirin EC 81 MG tablet Take 1 tablet by mouth at bedtime.   Yes Historical Provider, MD  atorvastatin (LIPITOR) 20 MG tablet Take 1 tablet by mouth daily. 01/03/16  Yes Historical Provider, MD  Cholecalciferol (VITAMIN D3) 2000 units capsule Take 1 capsule by mouth daily.   Yes Historical Provider, MD  FLUoxetine (PROZAC) 20 MG capsule Take 2 capsules by mouth daily. 01/03/16  Yes Historical Provider, MD  fluticasone (FLONASE) 50 MCG/ACT nasal spray Place 1 spray into the nose daily as needed. 09/11/15 09/10/16 Yes Historical Provider, MD  gabapentin (NEURONTIN) 100 MG capsule Take 1 capsule by mouth daily. 01/17/16  Yes Historical Provider, MD  hydrochlorothiazide (HYDRODIURIL) 25 MG tablet  Take 1 tablet by mouth daily. 11/23/15  Yes Historical Provider, MD  insulin NPH-regular Human (NOVOLIN 70/30) (70-30) 100 UNIT/ML injection Inject 20-60 Units into the skin 2 (two) times daily. 20 morning 60 evening   Yes Historical Provider, MD  latanoprost (XALATAN) 0.005 % ophthalmic solution Place 1 drop into both eyes daily. 01/19/16  Yes Historical Provider, MD  meloxicam (MOBIC) 7.5 MG tablet Take 1 tablet by mouth daily. 08/15/15  Yes Historical Provider, MD  metFORMIN (GLUCOPHAGE) 1000 MG tablet Take 1 tablet by mouth 2 (two) times daily. 01/03/16  Yes Historical Provider, MD  Multiple Vitamins-Minerals (MULTIVITAMIN ADULT PO) Take 1 tablet by mouth daily.   Yes Historical Provider, MD  omeprazole (PRILOSEC) 20 MG capsule Take 1 capsule by mouth daily. 08/15/15  Yes Historical Provider, MD  Probiotic Product (RISAQUAD-2) CAPS Take 1 capsule by mouth daily. 09/22/15 09/21/16 Yes Historical Provider, MD  vitamin B-12 (CYANOCOBALAMIN) 1000 MCG tablet Take 1,000 mcg by mouth daily. 12/28/15  Yes Historical Provider, MD    Family History No family history on file.  Social History Social History  Substance Use Topics  . Smoking status: Never Smoker  . Smokeless tobacco: Never Used  . Alcohol use Not on file     Allergies   Neosporin [neomycin-bacitracin zn-polymyx] and Sulfa antibiotics   Review of Systems Review of Systems  Gastrointestinal: Positive for abdominal pain.  Genitourinary: Positive for flank pain.  All other systems reviewed  and are negative.    Physical Exam Updated Vital Signs BP 111/66   Pulse 78   Temp 98.6 F (37 C) (Oral)   Resp 18   Ht 5\' 6"  (1.676 m)   Wt 172 lb (78 kg)   SpO2 93%   BMI 27.76 kg/m   Physical Exam  Constitutional: She is oriented to person, place, and time.  Slightly uncomfortable   HENT:  Head: Normocephalic.  Eyes: EOM are normal. Pupils are equal, round, and reactive to light.  Neck: Normal range of motion. Neck supple.    Cardiovascular: Normal rate, regular rhythm and normal heart sounds.   Pulmonary/Chest: Effort normal and breath sounds normal.  Abdominal: Soft. Bowel sounds are normal.  Mild RLQ tenderness, mild rebound and guarding.   Musculoskeletal: Normal range of motion.  Neurological: She is alert and oriented to person, place, and time.  Skin: Skin is warm.  Psychiatric: She has a normal mood and affect.  Nursing note and vitals reviewed.    ED Treatments / Results  Labs (all labs ordered are listed, but only abnormal results are displayed) Labs Reviewed  COMPREHENSIVE METABOLIC PANEL - Abnormal; Notable for the following:       Result Value   Chloride 98 (*)    Glucose, Bld 141 (*)    AST 42 (*)    All other components within normal limits  CBC - Abnormal; Notable for the following:    WBC 12.8 (*)    All other components within normal limits  URINALYSIS COMPLETEWITH MICROSCOPIC ([facility] ONLY) - Abnormal; Notable for the following:    Color, Urine YELLOW (*)    APPearance CLEAR (*)    Squamous Epithelial / LPF 0-5 (*)    All other components within normal limits  LIPASE, BLOOD  CBC  CREATININE, SERUM  BASIC METABOLIC PANEL  CBC    EKG  EKG Interpretation None       Radiology Ct Abdomen Pelvis W Contrast  Result Date: 01/24/2016 CLINICAL DATA:  Right lower quadrant abdominal pain. Reported rebound tenderness. EXAM: CT ABDOMEN AND PELVIS WITH CONTRAST TECHNIQUE: Multidetector CT imaging of the abdomen and pelvis was performed using the standard protocol following bolus administration of intravenous contrast. CONTRAST:  13mL ISOVUE-300 IOPAMIDOL (ISOVUE-300) INJECTION 61% COMPARISON:  Report abdominal ultrasound 06/24/2013 FINDINGS: Lower chest: Linear subsegmental atelectasis or scarring in the posterior basal segments of both lower lobes. Coronary artery atherosclerosis. Small type 1 hiatal hernia. Hepatobiliary: Diffuse hepatic steatosis. Nonspecific 6 mm hypodense lesion in  segment 6 medially, image 43/2. Cholecystectomy. Pancreas: Unremarkable Spleen: Unremarkable Adrenals/Urinary Tract: Adrenal glands normal. Right kidney upper pole 1.7 by 1.3 cm lesion measuring 11 Hounsfield units portal venous phase and 13 Hounsfield units delayed phase, likely a benign cyst. Left kidney upper pole exophytic 2.5 by 2.4 cm simple cyst noted. 1 mm punctate calcification in the left kidney lower pole on image 60/5 suspicious for nonobstructive nephrolithiasis. Minimal scarring in the right kidney lower pole. Several other tiny hypodense renal lesions are technically too small to characterize although statistically likely to be cysts. Part of the right distal ureter and the urinary bladder obscured by streak artifact from the right hip implant. Stomach/Bowel: There is an inflamed diverticulum type outpouching of bowel in the right abdomen at the level of the iliac crest. This is tangential to both the ascending colon and adjacent small bowel loops, and I am unsure which it is arising from, although I would probably favor this is arising from the ascending  colon. There may be a small amount of contained extraluminal gas and fluid along with inflammatory stranding, for example on images 25 through 34 of series 6 and images 24 through 30 of series 5. The patient has history of appendectomy and accordingly I do not believe that this is an inflamed appendix. Vascular/Lymphatic: Aortoiliac atherosclerotic vascular disease. No adenopathy identified. Reproductive: Unremarkable Other: No supplemental non-categorized findings. Musculoskeletal: Broad midline laxity or herniation of small bowel with rectus diastases, containing omentum and some marginal cecum. No strangulation or complicating feature. Markedly severe degenerative left hip arthropathy. Right hip implant in place. Lumbar spondylosis and degenerative disc disease causing impingement at L4-5. IMPRESSION: 1. The dominant finding is an inflamed  diverticulum-like structure in the right abdomen at the level of the iliac crest anteriorly. I am entirely certain whether this is arising from the ascending colon or adjacent small bowel, although I favor the former. There may be a small contained amount of extraluminal gas and fluid associated with this inflamed diverticulum. The patient has a history of appendectomy so I do not believe this is appendicitis. Adjacent inflammatory stranding noted. 2. Other imaging findings of potential clinical significance: Small type 1 hiatal hernia. Coronary artery atherosclerosis. Aortoiliac atherosclerotic vascular disease. Diffuse hepatic steatosis. Nonspecific 6 mm hypodense lesion in segment 6 of the liver medially, technically nonspecific although statistically likely to be benign. Several renal cysts. 1 mm left kidney lower pole nonobstructive calculus. Midline high rectus diastases containing omentum and some marginal cecum without complicating feature. Markedly severe degenerative left hip arthropathy. Impingement at L4-5 due to lumbar spondylosis and degenerative disc disease. Electronically Signed   By: Van Clines M.D.   On: 01/24/2016 20:26    Procedures Procedures (including critical care time)  Medications Ordered in ED Medications  ciprofloxacin (CIPRO) IVPB 400 mg (400 mg Intravenous New Bag/Given 01/24/16 2115)  metroNIDAZOLE (FLAGYL) IVPB 500 mg (not administered)  amLODipine (NORVASC) tablet 5 mg (not administered)  aspirin EC tablet 81 mg (not administered)  atorvastatin (LIPITOR) tablet 20 mg (not administered)  FLUoxetine (PROZAC) capsule 40 mg (not administered)  fluticasone (FLONASE) 50 MCG/ACT nasal spray 1 spray (not administered)  gabapentin (NEURONTIN) capsule 100 mg (not administered)  insulin aspart protamine- aspart (NOVOLOG MIX 70/30) injection 20 Units (not administered)  latanoprost (XALATAN) 0.005 % ophthalmic solution 1 drop (not administered)  metFORMIN (GLUCOPHAGE)  tablet 1,000 mg (not administered)  heparin injection 5,000 Units (not administered)  lactated ringers infusion (not administered)  HYDROcodone-acetaminophen (NORCO/VICODIN) 5-325 MG per tablet 1-2 tablet (not administered)  ondansetron (ZOFRAN) tablet 4 mg (not administered)    Or  ondansetron (ZOFRAN) injection 4 mg (not administered)  piperacillin-tazobactam (ZOSYN) IVPB 3.375 g (not administered)  famotidine (PEPCID) IVPB 20 mg premix (not administered)  morphine 4 MG/ML injection 4 mg (4 mg Intravenous Given 01/24/16 1940)  sodium chloride 0.9 % bolus 1,000 mL (0 mLs Intravenous Stopped 01/24/16 2045)  diatrizoate meglumine-sodium (GASTROGRAFIN) 66-10 % solution 15 mL (15 mLs Oral Given 01/24/16 1903)  iopamidol (ISOVUE-300) 61 % injection 100 mL (100 mLs Intravenous Contrast Given 01/24/16 2003)  ondansetron (ZOFRAN) injection 4 mg (4 mg Intravenous Given 01/24/16 1940)  morphine 4 MG/ML injection 4 mg (4 mg Intravenous Given 01/24/16 2114)     Initial Impression / Assessment and Plan / ED Course  I have reviewed the triage vital signs and the nursing notes.  Pertinent labs & imaging results that were available during my care of the patient were reviewed by me and considered in  my medical decision making (see chart for details).  Clinical Course    Monica Rice is a 65 y.o. female here with RLQ pain. Consider partial SBO vs gastro vs diverticulitis. Will get labs, UA, CT ab/pel.   9 pm CT showed possible diverticulitis with perforation. Consulted Dr. Burt Knack from surgery, who recommend IV abx. He saw patient and thought likely meckel's diverticulum. He will admit patient   Final Clinical Impressions(s) / ED Diagnoses   Final diagnoses:  None    New Prescriptions New Prescriptions   No medications on file     Drenda Freeze, MD 01/24/16 2155

## 2016-01-24 NOTE — ED Notes (Signed)
Pt finished with contrast. CT notified. 

## 2016-01-24 NOTE — ED Triage Notes (Signed)
Pt sent from clinic for RLQ abdominal pain. Pt reports rebound tenderness.  Pt has a cholecystectomy and appendectomy.

## 2016-01-25 ENCOUNTER — Encounter
Admission: EM | Disposition: A | Discharge status: Home / Self Care | Payer: Self-pay | Source: Home / Self Care | Attending: Surgery

## 2016-01-25 ENCOUNTER — Inpatient Hospital Stay: Payer: Medicare Other | Admitting: Anesthesiology

## 2016-01-25 ENCOUNTER — Encounter: Payer: Self-pay | Admitting: Anesthesiology

## 2016-01-25 DIAGNOSIS — K5710 Diverticulosis of small intestine without perforation or abscess without bleeding: Secondary | ICD-10-CM

## 2016-01-25 HISTORY — PX: LAPAROTOMY: SHX154

## 2016-01-25 HISTORY — PX: LAPAROSCOPY: SHX197

## 2016-01-25 LAB — CBC
HEMATOCRIT: 38.3 % (ref 35.0–47.0)
Hemoglobin: 13.3 g/dL (ref 12.0–16.0)
MCH: 30.3 pg (ref 26.0–34.0)
MCHC: 34.7 g/dL (ref 32.0–36.0)
MCV: 87.3 fL (ref 80.0–100.0)
Platelets: 127 10*3/uL — ABNORMAL LOW (ref 150–440)
RBC: 4.39 MIL/uL (ref 3.80–5.20)
RDW: 14.2 % (ref 11.5–14.5)
WBC: 8.8 10*3/uL (ref 3.6–11.0)

## 2016-01-25 LAB — BASIC METABOLIC PANEL
Anion gap: 8 (ref 5–15)
BUN: 12 mg/dL (ref 6–20)
CO2: 27 mmol/L (ref 22–32)
CREATININE: 0.5 mg/dL (ref 0.44–1.00)
Calcium: 8.6 mg/dL — ABNORMAL LOW (ref 8.9–10.3)
Chloride: 98 mmol/L — ABNORMAL LOW (ref 101–111)
GFR calc Af Amer: >60 mL/min (ref >60)
GLUCOSE: 229 mg/dL — AB (ref 65–99)
POTASSIUM: 3.8 mmol/L (ref 3.5–5.1)
Sodium: 133 mmol/L — ABNORMAL LOW (ref 135–145)

## 2016-01-25 LAB — GLUCOSE, CAPILLARY
Glucose-Capillary: 236 mg/dL — ABNORMAL HIGH (ref 65–99)
Glucose-Capillary: 189 mg/dL — ABNORMAL HIGH (ref 65–99)

## 2016-01-25 SURGERY — LAPAROSCOPY, DIAGNOSTIC
Anesthesia: General | Site: Abdomen | Wound class: Clean Contaminated

## 2016-01-25 MED ORDER — LIDOCAINE HCL (CARDIAC) 20 MG/ML IV SOLN
INTRAVENOUS | Status: DC | PRN
  Administered 2016-01-25: 30 mg via INTRAVENOUS

## 2016-01-25 MED ORDER — KETOROLAC TROMETHAMINE 30 MG/ML IJ SOLN
30.0000 mg | Freq: Four times a day (QID) | INTRAMUSCULAR | Status: DC
  Administered 2016-01-25 – 2016-01-29 (×15): 30 mg via INTRAVENOUS
  Filled 2016-01-25 (×23): qty 1

## 2016-01-25 MED ORDER — BUPIVACAINE-EPINEPHRINE (PF) 0.25% -1:200000 IJ SOLN
INTRAMUSCULAR | Status: AC
  Filled 2016-01-25: qty 30

## 2016-01-25 MED ORDER — CHLORHEXIDINE GLUCONATE CLOTH 2 % EX PADS: 6.0000 | MEDICATED_PAD | Freq: Once | CUTANEOUS | Status: DC

## 2016-01-25 MED ORDER — ONDANSETRON HCL 4 MG/2ML IJ SOLN: 4.0000 mg | Freq: Once | INTRAMUSCULAR | Status: DC | PRN

## 2016-01-25 MED ORDER — SODIUM CHLORIDE 0.9 % IV SOLN
INTRAVENOUS | Status: DC
  Administered 2016-01-25 (×2): via INTRAVENOUS

## 2016-01-25 MED ORDER — FENTANYL CITRATE (PF) 100 MCG/2ML IJ SOLN: 25.0000 ug | INTRAMUSCULAR | Status: DC | PRN

## 2016-01-25 MED ORDER — MORPHINE SULFATE (PF) 2 MG/ML IV SOLN
2.0000 mg | INTRAVENOUS | Status: DC | PRN
Start: 2016-01-25 — End: 2016-01-28
  Administered 2016-01-26: 2 mg via INTRAVENOUS
  Filled 2016-01-25: qty 1

## 2016-01-25 MED ORDER — ROCURONIUM BROMIDE 100 MG/10ML IV SOLN
INTRAVENOUS | Status: DC | PRN
  Administered 2016-01-25: 10 mg via INTRAVENOUS
  Administered 2016-01-25: 20 mg via INTRAVENOUS
  Administered 2016-01-25: 30 mg via INTRAVENOUS

## 2016-01-25 MED ORDER — BUPIVACAINE LIPOSOME 1.3 % IJ SUSP
INTRAMUSCULAR | Status: DC | PRN
  Administered 2016-01-25: 100 mL

## 2016-01-25 MED ORDER — DEXAMETHASONE SODIUM PHOSPHATE 10 MG/ML IJ SOLN
INTRAMUSCULAR | Status: DC | PRN
  Administered 2016-01-25: 10 mg via INTRAVENOUS

## 2016-01-25 MED ORDER — BUPIVACAINE LIPOSOME 1.3 % IJ SUSP
INTRAMUSCULAR | Status: AC
  Filled 2016-01-25: qty 20

## 2016-01-25 MED ORDER — ACETAMINOPHEN 10 MG/ML IV SOLN
INTRAVENOUS | Status: AC
  Filled 2016-01-25: qty 100

## 2016-01-25 MED ORDER — SODIUM CHLORIDE 0.9 % IV SOLN
INTRAVENOUS | Status: DC
  Administered 2016-01-25 – 2016-01-27 (×4): via INTRAVENOUS

## 2016-01-25 MED ORDER — PIPERACILLIN-TAZOBACTAM 3.375 G IVPB 30 MIN
3.3750 g | Freq: Once | INTRAVENOUS | Status: DC
  Administered 2016-01-25: 3.375 g via INTRAVENOUS
  Filled 2016-01-25: qty 50

## 2016-01-25 MED ORDER — ACETAMINOPHEN 10 MG/ML IV SOLN
INTRAVENOUS | Status: DC | PRN
Start: 2016-01-25 — End: 2016-01-25
  Administered 2016-01-25: 1000 mg via INTRAVENOUS

## 2016-01-25 MED ORDER — EPHEDRINE SULFATE 50 MG/ML IJ SOLN
INTRAMUSCULAR | Status: DC | PRN
  Administered 2016-01-25 (×2): 10 mg via INTRAVENOUS

## 2016-01-25 MED ORDER — SODIUM CHLORIDE 0.9 % IJ SOLN
INTRAMUSCULAR | Status: AC
  Filled 2016-01-25: qty 50

## 2016-01-25 MED ORDER — FAMOTIDINE IN NACL 20-0.9 MG/50ML-% IV SOLN
20.0000 mg | Freq: Two times a day (BID) | INTRAVENOUS | Status: DC
  Administered 2016-01-25 – 2016-01-27 (×5): 20 mg via INTRAVENOUS
  Filled 2016-01-25 (×6): qty 50

## 2016-01-25 MED ORDER — FENTANYL CITRATE (PF) 100 MCG/2ML IJ SOLN
INTRAMUSCULAR | Status: DC | PRN
  Administered 2016-01-25: 100 ug via INTRAVENOUS
  Administered 2016-01-25 (×2): 50 ug via INTRAVENOUS

## 2016-01-25 MED ORDER — MIDAZOLAM HCL 2 MG/2ML IJ SOLN
INTRAMUSCULAR | Status: DC | PRN
  Administered 2016-01-25: 2 mg via INTRAVENOUS

## 2016-01-25 MED ORDER — ONDANSETRON HCL 4 MG/2ML IJ SOLN
INTRAMUSCULAR | Status: DC | PRN
  Administered 2016-01-25: 4 mg via INTRAVENOUS

## 2016-01-25 MED ORDER — PROPOFOL 10 MG/ML IV BOLUS
INTRAVENOUS | Status: DC | PRN
Start: 2016-01-25 — End: 2016-01-25
  Administered 2016-01-25: 130 mg via INTRAVENOUS

## 2016-01-25 MED ORDER — SUCCINYLCHOLINE CHLORIDE 20 MG/ML IJ SOLN
INTRAMUSCULAR | Status: DC | PRN
  Administered 2016-01-25: 100 mg via INTRAVENOUS

## 2016-01-25 MED ORDER — FAMOTIDINE 20 MG PO TABS: 20.0000 mg | ORAL_TABLET | Freq: Two times a day (BID) | ORAL | Status: DC

## 2016-01-25 MED ORDER — SUGAMMADEX SODIUM 200 MG/2ML IV SOLN
INTRAVENOUS | Status: DC | PRN
  Administered 2016-01-25: 156 mg via INTRAVENOUS

## 2016-01-25 MED ORDER — PHENYLEPHRINE HCL 10 MG/ML IJ SOLN
INTRAMUSCULAR | Status: DC | PRN
  Administered 2016-01-25: 100 ug via INTRAVENOUS
  Administered 2016-01-25: 200 ug via INTRAVENOUS
  Administered 2016-01-25 (×2): 100 ug via INTRAVENOUS

## 2016-01-25 MED ORDER — KETOROLAC TROMETHAMINE 30 MG/ML IJ SOLN
INTRAMUSCULAR | Status: DC | PRN
  Administered 2016-01-25: 30 mg via INTRAVENOUS

## 2016-01-25 SURGICAL SUPPLY — 81 items
APPLIER CLIP 11 MED OPEN (CLIP) ×3
APPLIER CLIP 13 LRG OPEN (CLIP) ×3
BASIN GRAD PLASTIC 32OZ STRL (MISCELLANEOUS) ×3 IMPLANT
BLADE CLIPPER SURG (BLADE) ×3 IMPLANT
BLADE SURG 15 STRL LF DISP TIS (BLADE) ×1 IMPLANT
BLADE SURG 15 STRL SS (BLADE) ×2
BULB RESERV EVAC DRAIN JP 100C (MISCELLANEOUS) ×3 IMPLANT
CANISTER SUCT 1200ML W/VALVE (MISCELLANEOUS) ×3 IMPLANT
CANISTER SUCT 3000ML (MISCELLANEOUS) ×3 IMPLANT
CHLORAPREP W/TINT 26ML (MISCELLANEOUS) ×3 IMPLANT
CLEANER CAUTERY TIP 5X5 PAD (MISCELLANEOUS) ×1 IMPLANT
CLIP APPLIE 11 MED OPEN (CLIP) ×1 IMPLANT
CLIP APPLIE 13 LRG OPEN (CLIP) ×1 IMPLANT
CNTNR SPEC 2.5X3XGRAD LEK (MISCELLANEOUS) ×2
CONT SPEC 4OZ STER OR WHT (MISCELLANEOUS) ×4
CONTAINER SPEC 2.5X3XGRAD LEK (MISCELLANEOUS) ×2 IMPLANT
DEVICE TROCAR PUNCTURE CLOSURE (ENDOMECHANICALS) ×3 IMPLANT
DRAIN CHANNEL JP 19F (MISCELLANEOUS) ×3 IMPLANT
DRAPE INCISE IOBAN 66X60 STRL (DRAPES) ×3 IMPLANT
DRAPE LAPAROTOMY 100X77 ABD (DRAPES) ×3 IMPLANT
DRAPE TABLE BACK 80X90 (DRAPES) ×3 IMPLANT
DRESSING TELFA 4X3 1S ST N-ADH (GAUZE/BANDAGES/DRESSINGS) ×3 IMPLANT
DRSG TEGADERM 2-3/8X2-3/4 SM (GAUZE/BANDAGES/DRESSINGS) IMPLANT
DRSG TELFA 3X8 NADH (GAUZE/BANDAGES/DRESSINGS) ×9 IMPLANT
ELECT BLADE 6.5 EXT (BLADE) ×3 IMPLANT
ELECT REM PT RETURN 9FT ADLT (ELECTROSURGICAL) ×3
ELECTRODE REM PT RTRN 9FT ADLT (ELECTROSURGICAL) ×1 IMPLANT
GAUZE SPONGE 4X4 12PLY STRL (GAUZE/BANDAGES/DRESSINGS) ×9 IMPLANT
GELPORT LAPAROSCOPIC (MISCELLANEOUS) ×3 IMPLANT
GLOVE BIO SURGEON STRL SZ7 (GLOVE) ×3 IMPLANT
GLOVE BIO SURGEON STRL SZ8 (GLOVE) ×3 IMPLANT
GLOVE SURG SYN 7.5  E (GLOVE) ×2
GLOVE SURG SYN 7.5 E (GLOVE) ×1 IMPLANT
GOWN STRL REUS W/ TWL LRG LVL3 (GOWN DISPOSABLE) ×2 IMPLANT
GOWN STRL REUS W/TWL LRG LVL3 (GOWN DISPOSABLE) ×4
HANDLE SUCTION POOLE (INSTRUMENTS) ×1 IMPLANT
HANDLE YANKAUER SUCT BULB TIP (MISCELLANEOUS) ×6 IMPLANT
IRRIGATION STRYKERFLOW (MISCELLANEOUS) ×1 IMPLANT
IRRIGATOR STRYKERFLOW (MISCELLANEOUS) ×3
IV NS 1000ML (IV SOLUTION) ×2
IV NS 1000ML BAXH (IV SOLUTION) ×1 IMPLANT
L-HOOK LAP DISP 36CM (ELECTROSURGICAL) ×3
LHOOK LAP DISP 36CM (ELECTROSURGICAL) ×1 IMPLANT
LIGASURE IMPACT 36 18CM CVD LR (INSTRUMENTS) ×3 IMPLANT
LIQUID BAND (GAUZE/BANDAGES/DRESSINGS) ×3 IMPLANT
NEEDLE HYPO 22GX1.5 SAFETY (NEEDLE) ×6 IMPLANT
NEEDLE HYPO 25X1 1.5 SAFETY (NEEDLE) ×3 IMPLANT
PACK BASIN MAJOR ARMC (MISCELLANEOUS) ×3 IMPLANT
PACK LAP CHOLECYSTECTOMY (MISCELLANEOUS) ×3 IMPLANT
PAD CLEANER CAUTERY TIP 5X5 (MISCELLANEOUS) ×2
PENCIL ELECTRO HAND CTR (MISCELLANEOUS) ×3 IMPLANT
RELOAD PROXIMATE 75MM BLUE (ENDOMECHANICALS) ×6 IMPLANT
SCISSORS METZENBAUM CVD 33 (INSTRUMENTS) ×3 IMPLANT
SLEEVE ADV FIXATION 5X100MM (TROCAR) ×6 IMPLANT
SLEEVE ENDOPATH XCEL 5M (ENDOMECHANICALS) ×3 IMPLANT
SPONGE LAP 18X18 5 PK (GAUZE/BANDAGES/DRESSINGS) ×12 IMPLANT
SPONGE LAP 18X36 2PK (MISCELLANEOUS) IMPLANT
STAPLER PROXIMATE 75MM BLUE (STAPLE) ×3 IMPLANT
STAPLER SKIN PROX 35W (STAPLE) ×9 IMPLANT
SUCTION POOLE HANDLE (INSTRUMENTS) ×3
SUT ETHILON 3-0 (SUTURE) ×3 IMPLANT
SUT MNCRL AB 4-0 PS2 18 (SUTURE) IMPLANT
SUT PDS AB 0 CT1 27 (SUTURE) ×6 IMPLANT
SUT PDS AB 1 TP1 96 (SUTURE) ×6 IMPLANT
SUT SILK 2 0 (SUTURE)
SUT SILK 2 0 SH CR/8 (SUTURE) ×6 IMPLANT
SUT SILK 2 0SH CR/8 30 (SUTURE) IMPLANT
SUT SILK 2-0 18XBRD TIE 12 (SUTURE) IMPLANT
SUT VIC AB 0 CT1 36 (SUTURE) IMPLANT
SUT VIC AB 2-0 SH 27 (SUTURE)
SUT VIC AB 2-0 SH 27XBRD (SUTURE) IMPLANT
SUT VICRYL 0 AB UR-6 (SUTURE) ×3 IMPLANT
SYR 30ML LL (SYRINGE) ×6 IMPLANT
SYR 3ML LL SCALE MARK (SYRINGE) ×3 IMPLANT
TAPE MICROFOAM 4IN (TAPE) ×3 IMPLANT
TRAP SPECIMEN MUCOUS 40CC (MISCELLANEOUS) ×3 IMPLANT
TRAY FOLEY W/METER SILVER 16FR (SET/KITS/TRAYS/PACK) ×3 IMPLANT
TROCAR XCEL BLUNT TIP 100MML (ENDOMECHANICALS) ×3 IMPLANT
TROCAR XCEL NON-BLD 5MMX100MML (ENDOMECHANICALS) ×3 IMPLANT
TROCAR Z-THREAD OPTICAL 5X100M (TROCAR) ×3 IMPLANT
TUBING INSUFFLATOR HI FLOW (MISCELLANEOUS) ×3 IMPLANT

## 2016-01-25 NOTE — Anesthesia Preprocedure Evaluation (Signed)
Anesthesia Evaluation  Patient identified by MRN, date of birth, ID band Patient awake    Reviewed: Allergy & Precautions, NPO status , Patient's Chart, lab work & pertinent test results  Airway Mallampati: III  TM Distance: <3 FB     Dental  (+) Chipped   Pulmonary neg pulmonary ROS,    Pulmonary exam normal        Cardiovascular hypertension, Pt. on medications Normal cardiovascular exam     Neuro/Psych negative neurological ROS  negative psych ROS   GI/Hepatic negative GI ROS, Neg liver ROS,   Endo/Other  diabetes, Well Controlled, Type 2, Oral Hypoglycemic Agents  Renal/GU negative Renal ROS  negative genitourinary   Musculoskeletal   Abdominal Normal abdominal exam  (+)   Peds negative pediatric ROS (+)  Hematology negative hematology ROS (+)   Anesthesia Other Findings   Reproductive/Obstetrics                             Anesthesia Physical Anesthesia Plan  ASA: III and emergent  Anesthesia Plan: General   Post-op Pain Management:    Induction: Intravenous  Airway Management Planned: Oral ETT  Additional Equipment:   Intra-op Plan:   Post-operative Plan: Extubation in OR  Informed Consent: I have reviewed the patients History and Physical, chart, labs and discussed the procedure including the risks, benefits and alternatives for the proposed anesthesia with the patient or authorized representative who has indicated his/her understanding and acceptance.   Dental advisory given  Plan Discussed with: CRNA and Surgeon  Anesthesia Plan Comments:         Anesthesia Quick Evaluation

## 2016-01-25 NOTE — Anesthesia Postprocedure Evaluation (Signed)
Anesthesia Post Note  Patient: Monica Rice  Procedure(s) Performed: Procedure(s) (LRB): LAPAROSCOPY DIAGNOSTIC (N/A) EXPLORATORY LAPAROTOMY (N/A)  Patient location during evaluation: PACU Anesthesia Type: General Level of consciousness: awake and alert and oriented Pain management: pain level controlled Vital Signs Assessment: post-procedure vital signs reviewed and stable Respiratory status: spontaneous breathing, nonlabored ventilation, respiratory function stable and patient connected to face mask oxygen Cardiovascular status: blood pressure returned to baseline and stable Postop Assessment: no signs of nausea or vomiting Anesthetic complications: no Comments: Discussed recommendation for CPAP use overnight.     Last Vitals:  Vitals:   01/25/16 1655 01/25/16 1730  BP: (!) 107/56 (!) 98/55  Pulse: 89 88  Resp: 14   Temp: 36.3 C     Last Pain:  Vitals:   01/25/16 1655  TempSrc:   PainSc: 0-No pain                 [place] Record

## 2016-01-25 NOTE — Progress Notes (Signed)
Inpatient Diabetes Program Recommendations  AACE/ADA: New Consensus Statement on Inpatient Glycemic Control (2015)  Target Ranges:  Prepandial:   less than 140 mg/dL      Peak postprandial:   less than 180 mg/dL (1-2 hours)      Critically ill patients:  140 - 180 mg/dL   Results for MATHEL, BRAINARD (MRN [id number]) as of 01/25/2016 11:15  Ref. Range 01/24/2016 16:59 01/25/2016 06:24  Glucose Latest Ref Range: 65 - 99 mg/dL 141 (H) 229 (H)   Review of Glycemic Control  Diabetes history: DM2 Outpatient Diabetes medications: 70/30 20 units QAM, 70/30 60 QPM, Metformin 1000 mg BID Current orders for Inpatient glycemic control: 70/30 20 units BID, Metformin 1000 mg BID  Inpatient Diabetes Program Recommendations: Insulin - Basal: If patient remains NPO, please discontinue 70/30 and order Lantus 23 units QHS (based on 78 kg x 0.3 units). Correction (SSI): Patient is now NPO for surgery. Please order CBGs with Novolog correction scale Q4H (change to ACHS when diet resumed).  Thanks, Barnie Alderman, RN, MSN, CDE Diabetes Coordinator Inpatient Diabetes Program [phone] (Team Pager from [place] to [place]) [phone] ([place] office) [phone] [facility] office) [phone] [facility] office)

## 2016-01-25 NOTE — Anesthesia Procedure Notes (Signed)
Procedure Name: Intubation Date/Time: 01/25/2016 1:22 PM Performed by: Jonna Clark Pre-anesthesia Checklist: Patient identified, Patient being monitored, Timeout performed, Emergency Drugs available and Suction available Patient Re-evaluated:Patient Re-evaluated prior to inductionOxygen Delivery Method: Circle system utilized Preoxygenation: Pre-oxygenation with 100% oxygen Intubation Type: IV induction Ventilation: Mask ventilation without difficulty Laryngoscope Size: 3 and McGraph Grade View: Grade III Tube type: Oral Tube size: 7.0 mm Number of attempts: 1 Airway Equipment and Method: Stylet Placement Confirmation: ETT inserted through vocal cords under direct vision,  positive ETCO2 and breath sounds checked- equal and bilateral Secured at: 21 cm Tube secured with: Tape Dental Injury: Teeth and Oropharynx as per pre-operative assessment  Difficulty Due To: Difficulty was unanticipated, Difficult Airway- due to anterior larynx and Difficult Airway- due to limited oral opening Future Recommendations: Recommend- induction with short-acting agent, and alternative techniques readily available

## 2016-01-25 NOTE — Op Note (Addendum)
PROCEDURES: 1. Diagnostic laparoscopy 2. Exploratory laparotomy 3. Small bowel resection with primary anastomosis incorporating the Meckel's diverticulum 4. Omental flap 5. Excision of mesh from abdominal wall 6. Lysis of adhesions taking at least 30 minutes of total operative time 7. Placement of # 19 blake drain in the pelvis  Pre-operative Diagnosis: Meckel diverticulitis  Post-operative Diagnosis: Perforated Meckel's  Surgeon: [place]   Assistants: Nestor Lewandowsky, M.D.  Anesthesia: General endotracheal anesthesia  ASA Class: 2  Surgeon: Caroleen Hamman , MD FACS  Anesthesia: Gen. with endotracheal tube  Findings: Perforated Meckel's with significant inflammatory reaction and significant adhesions from the small bowel to the abdominal wall and interloop bowel as well as the omentum to the abdominal wall. Partially incorporated mesh at the periumbilical area  Estimated Blood Loss: 50-100cc               Specimens: Mesh and Small bowel       Complications: none               Condition: stable  Procedure Details  The patient was seen again in the Holding Room. The benefits, complications, treatment options, and expected outcomes were discussed with the patient. The risks of bleeding, infection, recurrence of symptoms, failure to resolve symptoms,  bowel injury, any of which could require further surgery were reviewed with the patient.   The patient was taken to Operating Room, identified as Monica Rice and the procedure verified.  A Time Out was held and the above information confirmed.  Prior to the induction of general anesthesia, antibiotic prophylaxis was administered. VTE prophylaxis was in place. General endotracheal anesthesia was then administered and tolerated well. After the induction, the abdomen was prepped with Chloraprep and draped in the sterile fashion. The patient was positioned in the supine position.  The umbilical incision was created and to  stay suture placed in the fascia and the abdomen was entered under direct visualization. Hasson trochar placed and pneumoperitoneum obtained. 25 mm trocar was placed under direct sensation. We sought thick fluid consistent with enteric contents and significant inflammatory reaction. At this point because of the significant adhesions and inflammatory process I decided to convert to an exploratory laparotomy. Midline laparotomy created with a 10 blade knife. They we encounter a periumbilical partially incorporated mesh and that we divided using Mayo scissors. The adhesions were extensive to the mesh we were also able to lyse adhesions with a combination of cadaveric and scissors. There was significant adhesions from the omentum to the abdominal wall as well as from the small bowel to the abdominal wall. Once the adhesions were taken down were able to run the bowel and identified the area of perforation at this was approximately 2 feet from the ileocecal valve and with a a Meckel's diverticulum. We perform a small bowel resection incorporating the perforated Meckel diverticulum in the standard fashion using a 75 standard stapled GIA. The anastomosis was performed in a side-to-side functional end to end fashion. The mesentery was divided with LigaSure device and the defect was closed with interrupted silk sutures. The anastomosis was widely patent and the bowel was  Viable. We run the bowel and there was no evidence of any other lesions. There was evidence of very mobile and on attached cecum. This was we left it in situ. We aspirated and some enteric contents and irrigated abdominal wall and abdominal cavity with a combination of saline and sterile water. A Blake drain was placed in the pelvis to prevent  any pelvic abscess. The omentum was mobilized from the abdominal wall and placed to create a flap to the anastomotic site.  A second look and revealed no evidence of bowel injury and a good patent anastomosis.  Attention was then turned to the mesh side that this had 2 layers and one there was incorporated but the second layer was not due to the concern of the infected field and leaving unincorporated mesh material I decided to excise the mesh. This was done using a combination of electrocautery and Mayo scissors. We also were able to raise flaps and delineated the midline fascia to have an adequate mobilization and closure. The and fascia was closed with a continue 0 PDS in the standard fashion. We felt that and there was no need for retention sutures or for any additional mesh overlay reinforcement to the abdominal wall.   Liposomal Marcaine was injected on all incision sites under direct visualization. Heparin was used to close the skin. Needle and laparotomy count were correct and there were no immediate occasions  Please note that the additional lysis of adhesions and excision of mesh added additional time for the procedure taking approximately 40 minutes of an additional total operative time.  Caroleen Hamman, MD, FACS

## 2016-01-25 NOTE — Progress Notes (Signed)
RT called to meet PACU staff when patient arrived to 201.  Patient awake, though groggy, upon arrival with sats 93% on 3LPM [place].  CPAP placed in room for PRN use per Dr. Dahlia Byes order.  Will continue to monitor.

## 2016-01-25 NOTE — Pre-Procedure Instructions (Addendum)
Notified Dr Dahlia Byes patient needs CPAP, order given.  Called cardiopulmonary. Requesting CPAP be set up in room.  They stated "keep patient on face mask.  Patient can't be on floor with continuous CPAP ok only if used at night.  She didn't need it last night."  Called anesthesia regarding above mentioned.  OK given to leave patient on face mask.  Called and notified floor regarding CPAP.  Called and notified cardiopulmonary a 2nd time that the patient needed a CPAP set up in room and may need to place on patient on arrival to floor if she feel back off to sleep and O2 sats fell below 92%.

## 2016-01-25 NOTE — Transfer of Care (Signed)
Immediate Anesthesia Transfer of Care Note  Patient: Monica Rice  Procedure(s) Performed: Procedure(s): LAPAROSCOPY DIAGNOSTIC (N/A) EXPLORATORY LAPAROTOMY (N/A)  Patient Location: PACU  Anesthesia Type:general  Level of Consciousness: patient cooperative and lethargic  Airway & Oxygen Therapy: Patient Spontanous Breathing and Patient connected to face mask oxygen  Post-op Assessment: Report given to RN and Post -op Vital signs reviewed and stable  Post vital signs: Reviewed and stable  Last Vitals:  Vitals:   01/25/16 1216 01/25/16 1545  BP: (!) 108/59 113/62  Pulse: 87 99  Resp: 18 14  Temp:  37.5 C    Last Pain:  Vitals:   01/25/16 1545  TempSrc:   PainSc: Asleep         Complications: No apparent anesthesia complications

## 2016-01-25 NOTE — Progress Notes (Signed)
Diverticulitis of the small bowel with caution wall contained free air. Overnight actually her condition deteriorated and her abdominal pain is worse. She had a house require some oxygen and her heart rate now is in the 100s.  PE : She is nontoxic-appearing but is complaining of significant abdominal pain Abd: Distended and with rebound tenderness and focal peritonitis. The patient definitely stated that it has been a significant worsening on her exam and her pain.  A/P diverticulitis of the small bowel now her abdominal exam is concerning and she is Focal peritonitis that has worsened. Discussed with the patient in detail and after extensive discussion with the patient I do think that is in the patient's best interest to at least perform a diagnostic laparoscopy given that her exam has worsened. There is Probability of conversion to open if we can not perform therapeutic maneuvers with the laparoscope. Procedure discussed with the patient in detail, risk, benefits and possible complications. She understands and wishes to proceed. We will proceed with diagnostic laparoscopy possible laparotomy.

## 2016-01-26 DIAGNOSIS — K5700 Diverticulitis of small intestine with perforation and abscess without bleeding: Secondary | ICD-10-CM | POA: Diagnosis not present

## 2016-01-26 LAB — CBC
HCT: 34.4 % — ABNORMAL LOW (ref 35.0–47.0)
Hemoglobin: 12.1 g/dL (ref 12.0–16.0)
MCH: 30.8 pg (ref 26.0–34.0)
MCHC: 35.1 g/dL (ref 32.0–36.0)
MCV: 87.8 fL (ref 80.0–100.0)
PLATELETS: 136 10*3/uL — AB (ref 150–440)
RBC: 3.92 MIL/uL (ref 3.80–5.20)
RDW: 14.2 % (ref 11.5–14.5)
WBC: 8.3 10*3/uL (ref 3.6–11.0)

## 2016-01-26 LAB — BASIC METABOLIC PANEL
Anion gap: 8 (ref 5–15)
BUN: 11 mg/dL (ref 6–20)
CALCIUM: 7.6 mg/dL — AB (ref 8.9–10.3)
CO2: 28 mmol/L (ref 22–32)
CREATININE: 0.66 mg/dL (ref 0.44–1.00)
Chloride: 102 mmol/L (ref 101–111)
GFR calc Af Amer: >60 mL/min (ref >60)
GFR calc non Af Amer: >60 mL/min (ref >60)
GLUCOSE: 273 mg/dL — AB (ref 65–99)
Potassium: 4.1 mmol/L (ref 3.5–5.1)
Sodium: 138 mmol/L (ref 135–145)

## 2016-01-26 LAB — GLUCOSE, CAPILLARY
GLUCOSE-CAPILLARY: 257 mg/dL — AB (ref 65–99)
GLUCOSE-CAPILLARY: 148 mg/dL — AB (ref 65–99)
Glucose-Capillary: 237 mg/dL — ABNORMAL HIGH (ref 65–99)
Glucose-Capillary: 170 mg/dL — ABNORMAL HIGH (ref 65–99)

## 2016-01-26 MED ORDER — INSULIN ASPART 100 UNIT/ML ~~LOC~~ SOLN
0.0000 [IU] | Freq: Three times a day (TID) | SUBCUTANEOUS | Status: DC
Start: 2016-01-26 — End: 2016-01-29
  Administered 2016-01-26: 8 [IU] via SUBCUTANEOUS
  Administered 2016-01-26: 2 [IU] via SUBCUTANEOUS
  Administered 2016-01-26: 5 [IU] via SUBCUTANEOUS
  Administered 2016-01-27 – 2016-01-29 (×6): 3 [IU] via SUBCUTANEOUS
  Filled 2016-01-26: qty 8
  Filled 2016-01-26: qty 5
  Filled 2016-01-26 (×4): qty 3
  Filled 2016-01-26: qty 2
  Filled 2016-01-26 (×2): qty 3

## 2016-01-26 NOTE — Progress Notes (Signed)
Notified Dr. Dahlia Byes that patient has a blood glucose of 148. Per MD hold 70/30 dose and give sliding scale coverage.

## 2016-01-26 NOTE — Progress Notes (Signed)
Notified Dr. Dahlia Byes that patient has an order for 70/30 insulin but does not have orders for fingersticks. Okay per MD to place order for fingersticks ACHS with sliding scale coverage. Will hold dose of 70/30 insulin this AM. Also per MD place order for clear liquids.

## 2016-01-26 NOTE — Progress Notes (Signed)
POD # 1 lap for perf Meckel's  Doing very well AVSS Minimal pain  PE NAD Abd: soft, dressing intact Jp serous fluid  A/P doing well Clears Mobilize Continue A/Bs DC foley

## 2016-01-27 LAB — GLUCOSE, CAPILLARY
GLUCOSE-CAPILLARY: 163 mg/dL — AB (ref 65–99)
Glucose-Capillary: 159 mg/dL — ABNORMAL HIGH (ref 65–99)
Glucose-Capillary: 107 mg/dL — ABNORMAL HIGH (ref 65–99)
Glucose-Capillary: 135 mg/dL — ABNORMAL HIGH (ref 65–99)

## 2016-01-27 LAB — BASIC METABOLIC PANEL
Anion gap: 4 — ABNORMAL LOW (ref 5–15)
BUN: 11 mg/dL (ref 6–20)
CALCIUM: 7.5 mg/dL — AB (ref 8.9–10.3)
CHLORIDE: 106 mmol/L (ref 101–111)
CO2: 27 mmol/L (ref 22–32)
CREATININE: 0.6 mg/dL (ref 0.44–1.00)
GFR calc Af Amer: >60 mL/min (ref >60)
GFR calc non Af Amer: >60 mL/min (ref >60)
GLUCOSE: 185 mg/dL — AB (ref 65–99)
Potassium: 3.3 mmol/L — ABNORMAL LOW (ref 3.5–5.1)
Sodium: 137 mmol/L (ref 135–145)

## 2016-01-27 MED ORDER — CIPROFLOXACIN HCL 500 MG PO TABS
500.0000 mg | ORAL_TABLET | Freq: Two times a day (BID) | ORAL | Status: DC
  Administered 2016-01-27 – 2016-01-29 (×5): 500 mg via ORAL
  Filled 2016-01-27 (×5): qty 1

## 2016-01-27 MED ORDER — ACETAMINOPHEN 325 MG PO TABS
650.0000 mg | ORAL_TABLET | ORAL | Status: DC | PRN
  Administered 2016-01-27: 650 mg via ORAL
  Filled 2016-01-27 (×2): qty 2

## 2016-01-27 MED ORDER — METRONIDAZOLE 500 MG PO TABS
500.0000 mg | ORAL_TABLET | Freq: Three times a day (TID) | ORAL | Status: DC
  Administered 2016-01-27 – 2016-01-29 (×6): 500 mg via ORAL
  Filled 2016-01-27 (×6): qty 1

## 2016-01-27 MED ORDER — GABAPENTIN 300 MG PO CAPS
300.0000 mg | ORAL_CAPSULE | Freq: Every day | ORAL | Status: DC
  Administered 2016-01-27 – 2016-01-29 (×3): 300 mg via ORAL
  Filled 2016-01-27 (×3): qty 1

## 2016-01-27 MED ORDER — POTASSIUM CHLORIDE 20 MEQ PO PACK
20.0000 meq | PACK | Freq: Two times a day (BID) | ORAL | Status: AC
  Administered 2016-01-27 – 2016-01-28 (×3): 20 meq via ORAL
  Filled 2016-01-27 (×3): qty 1

## 2016-01-27 MED ORDER — FAMOTIDINE 20 MG PO TABS
20.0000 mg | ORAL_TABLET | Freq: Two times a day (BID) | ORAL | Status: DC
  Administered 2016-01-27 – 2016-01-28 (×2): 20 mg via ORAL
  Filled 2016-01-27 (×2): qty 1

## 2016-01-27 MED ORDER — METRONIDAZOLE 500 MG PO TABS
500.0000 mg | ORAL_TABLET | Freq: Three times a day (TID) | ORAL | Status: DC
  Administered 2016-01-27: 500 mg via ORAL
  Filled 2016-01-27: qty 1

## 2016-01-27 NOTE — Progress Notes (Signed)
POD # 2 Doing well Taking clears. No N/V AVSS  PE NAD Abd: soft, incision c/d/i, staples in place, no infection. JP serous  A/P regular diet Ambulate transition to Po A/B for perforation Likely DC in am. May remove drain tomorrow

## 2016-01-27 NOTE — Progress Notes (Signed)
PHARMACIST - PHYSICIAN COMMUNICATION  CONCERNING: IV to Oral Route Change Policy  RECOMMENDATION: This patient is receiving FAMOTIDINE by the intravenous route.  Based on criteria approved by the Pharmacy and Therapeutics Committee, the intravenous medication(s) is/are being converted to the equivalent oral dose form(s).   DESCRIPTION: These criteria include:  The patient is eating (either orally or via tube) and/or has been taking other orally administered medications for a least 24 hours  The patient has no evidence of active gastrointestinal bleeding or impaired GI absorption (gastrectomy, short bowel, patient on TNA or NPO).  If you have questions about this conversion, please contact the Pharmacy Department  []   [phone] )  Monica Rice [x]   [phone] )  [name] []   [phone] )  [name] []   [phone] )  [name] []   [phone] )  [place], [facility] 01/27/2016 4:48 PM

## 2016-01-28 ENCOUNTER — Encounter: Payer: Self-pay | Admitting: Surgery

## 2016-01-28 LAB — GLUCOSE, CAPILLARY
GLUCOSE-CAPILLARY: 152 mg/dL — AB (ref 65–99)
GLUCOSE-CAPILLARY: 120 mg/dL — AB (ref 65–99)
GLUCOSE-CAPILLARY: 67 mg/dL (ref 65–99)
Glucose-Capillary: 167 mg/dL — ABNORMAL HIGH (ref 65–99)

## 2016-01-28 MED ORDER — PANTOPRAZOLE SODIUM 40 MG PO TBEC
40.0000 mg | DELAYED_RELEASE_TABLET | Freq: Every day | ORAL | Status: DC
Start: 2016-01-28 — End: 2016-01-29
  Administered 2016-01-28 – 2016-01-29 (×2): 40 mg via ORAL
  Filled 2016-01-28 (×2): qty 1

## 2016-01-28 MED ORDER — FAMOTIDINE IN NACL 20-0.9 MG/50ML-% IV SOLN: 20.0000 mg | INTRAVENOUS | Status: DC

## 2016-01-28 MED ORDER — OXYCODONE-ACETAMINOPHEN 5-325 MG PO TABS: 1.0000 | ORAL_TABLET | ORAL | Status: DC | PRN

## 2016-01-28 NOTE — Care Management Important Message (Signed)
Important Message  Patient Details  Name: Monica Rice MRN: [id number] Date of Birth: 11/20/1950   Medicare Important Message Given:  Yes    Beverly Sessions, RN 01/28/2016, 5:06 PM

## 2016-01-28 NOTE — Progress Notes (Signed)
65 yr old female POD#3 from Ex lap for perforated Meckels Diverticulum.  Patient is doing well today.  She is tolerating a heart healthy diet.  She has been passing flatus.  She walked 12 laps in the hallway yesterday.  She states pain well controlled.  She has not yet had a BM.   Vitals:   01/28/16 0609 01/28/16 1234  BP: 138/79 (!) 144/95  Pulse: 85 95  Resp:  18  Temp: 98.1 F (36.7 C) 98.1 F (36.7 C)   I/O last 3 completed shifts: In: 1963.3 [P.O.:240; I.V.:1653.5; IV Piggyback:69.8] Out: 2945 [Urine:2875; Drains:70] Total I/O In: 3 [P.O.:360] Out: 2205 [Urine:2200; Drains:5]   PE:  Gen: NAD Abd: soft, moderately distended, incisions c/d/i with staples in place, JP with serous drainage, no erytherma Ext: 2+ pulses, no edema  CBC Latest Ref Rng & Units 01/26/2016 01/25/2016 01/24/2016  WBC 3.6 - 11.0 K/uL 8.3 8.8 12.8(H)  Hemoglobin 12.0 - 16.0 g/dL 12.1 13.3 14.1  Hematocrit 35.0 - 47.0 % 34.4(L) 38.3 42.1  Platelets 150 - 440 K/uL 136(L) 127(L) 173   CMP Latest Ref Rng & Units 01/27/2016 01/26/2016 01/25/2016  Glucose 65 - 99 mg/dL 185(H) 273(H) 229(H)  BUN 6 - 20 mg/dL 11 11 12   Creatinine 0.44 - 1.00 mg/dL 0.60 0.66 0.50  Sodium 135 - 145 mmol/L 137 138 133(L)  Potassium 3.5 - 5.1 mmol/L 3.3(L) 4.1 3.8  Chloride 101 - 111 mmol/L 106 102 98(L)  CO2 22 - 32 mmol/L 27 28 27   Calcium 8.9 - 10.3 mg/dL 7.5(L) 7.6(L) 8.6(L)  Total Protein 6.5 - 8.1 g/dL - - -  Total Bilirubin 0.3 - 1.2 mg/dL - - -  Alkaline Phos 38 - 126 U/L - - -  AST 15 - 41 U/L - - -  ALT 14 - 54 U/L - - -   A/P: 65 yr old female POD#3 from Ex lap for perforated Meckels Diverticulum.   Pain: Toradol and percocet  Tolerating regular diet  Cipro/Flagyl for total of 10 day course due to peritonitis, WBC normalized Awaiting return of BMs Encourage ambulation

## 2016-01-29 LAB — GLUCOSE, CAPILLARY
GLUCOSE-CAPILLARY: 165 mg/dL — AB (ref 65–99)
GLUCOSE-CAPILLARY: 176 mg/dL — AB (ref 65–99)

## 2016-01-29 LAB — SURGICAL PATHOLOGY

## 2016-01-29 MED ORDER — IBUPROFEN 800 MG PO TABS: 800.0000 mg | ORAL_TABLET | Freq: Three times a day (TID) | ORAL | 0 refills | Status: DC | PRN

## 2016-01-29 NOTE — Discharge Summary (Signed)
Physician Discharge Summary  Patient ID: Monica Rice MRN: [id number] DOB/AGE: 67-Oct-1952 67 y.o.  Admit date: 01/24/2016 Discharge date: 01/29/2016  Admission Diagnoses: Perf of small bowel  Discharge Diagnoses:  Active Problems:   Diverticula of small intestine   Discharged Condition: good  Hospital Course: 65 yr old female who had perforated meckles diverticulum.  She underwent Ex lap with resection of small bowel with Dr. Dahlia Byes on 8/25.  She has been doing well since then.  She has pain well controlled.  JP drain removed today.  She has ben tolerating regular diet and had BMs today.    Consults: None  Significant Diagnostic Studies: CT  Treatments: surgery: Ex lap with resection of small bowel   Discharge Exam: Blood pressure 140/68, pulse 73, temperature 98 F (36.7 C), temperature source Oral, resp. rate 19, height 5\' 6"  (1.676 m), weight 172 lb (78 kg), SpO2 95 %. General appearance: alert, cooperative and no distress GI: soft, appropriately tender, incision c/d/i, jp drain removed.  Extremities: extremities normal, atraumatic, no cyanosis or edema  Disposition: 01-Home or Self Care  Discharge Instructions    Call MD for:  difficulty breathing, headache or visual disturbances    Complete by:  As directed   Call MD for:  persistant nausea and vomiting    Complete by:  As directed   Call MD for:  redness, tenderness, or signs of infection (pain, swelling, redness, odor or green/yellow discharge around incision site)    Complete by:  As directed   Call MD for:  severe uncontrolled pain    Complete by:  As directed   Call MD for:  temperature >100.4    Complete by:  As directed   Diet - low sodium heart healthy    Complete by:  As directed   Discontinue JP/Blake drain    Complete by:  As directed   Driving Restrictions    Complete by:  As directed   No driving while on prescription pain medication   Increase activity slowly    Complete by:  As directed   Lifting  restrictions    Complete by:  As directed   No lifting over 15lbs for 6 weeks   May shower / Bathe    Complete by:  As directed   No dressing needed    Complete by:  As directed       Medication List    TAKE these medications   amLODipine 5 MG tablet Commonly known as:  NORVASC Take 1 tablet by mouth daily.   aspirin EC 81 MG tablet Take 1 tablet by mouth at bedtime.   atorvastatin 20 MG tablet Commonly known as:  LIPITOR Take 1 tablet by mouth daily.   FLUoxetine 20 MG capsule Commonly known as:  PROZAC Take 2 capsules by mouth daily.   fluticasone 50 MCG/ACT nasal spray Commonly known as:  FLONASE Place 1 spray into the nose daily as needed.   gabapentin 100 MG capsule Commonly known as:  NEURONTIN Take 1 capsule by mouth daily.   hydrochlorothiazide 25 MG tablet Commonly known as:  HYDRODIURIL Take 1 tablet by mouth daily.   ibuprofen 800 MG tablet Commonly known as:  ADVIL,MOTRIN Take 1 tablet (800 mg total) by mouth every 8 (eight) hours as needed.   insulin NPH-regular Human (70-30) 100 UNIT/ML injection Commonly known as:  NOVOLIN 70/30 Inject 20-60 Units into the skin 2 (two) times daily. 20 morning 60 evening   latanoprost 0.005 % ophthalmic solution Commonly  known as:  XALATAN Place 1 drop into both eyes daily.   meloxicam 7.5 MG tablet Commonly known as:  MOBIC Take 1 tablet by mouth daily.   metFORMIN 1000 MG tablet Commonly known as:  GLUCOPHAGE Take 1 tablet by mouth 2 (two) times daily.   MULTIVITAMIN ADULT PO Take 1 tablet by mouth daily.   omeprazole 20 MG capsule Commonly known as:  PRILOSEC Take 1 capsule by mouth daily.   RISAQUAD-2 Caps Take 1 capsule by mouth daily.   vitamin B-12 1000 MCG tablet Commonly known as:  CYANOCOBALAMIN Take 1,000 mcg by mouth daily.   Vitamin D3 2000 units capsule Take 1 capsule by mouth daily.      Follow-up Information    Jules Husbands, MD. Go on 01/31/2016.   Specialty:  General  Surgery Why:  Thursday at 11:30am for hospital follow-up. Contact information: [address] [place] [address] [phone]           Signed: Hubbard Robinson 01/29/2016, 8:41 PM

## 2016-01-29 NOTE — Progress Notes (Signed)
Inpatient Diabetes Program Recommendations  AACE/ADA: New Consensus Statement on Inpatient Glycemic Control (2015)  Target Ranges:  Prepandial:   less than 140 mg/dL      Peak postprandial:   less than 180 mg/dL (1-2 hours)      Critically ill patients:  140 - 180 mg/dL   Results for WELMA, CRILE (MRN [id number]) as of 01/29/2016 10:40  Ref. Range 01/28/2016 07:42 01/28/2016 11:29 01/28/2016 16:30 01/28/2016 21:15  Glucose-Capillary Latest Ref Range: 65 - 99 mg/dL 167 (H) 152 (H) 120 (H) 67    Home DM Meds: Metformin 1000 mg bid        70/30 Insulin: 20 units AM/ 60 units PM  Current Insulin Orders: 70/30 Insulin: 20 units bidwc      Novolog Moderate Correction Scale/ SSI (0-15 units) TID AC + HS     MD- Patient with mild episode of Hypoglycemia last evening at bedtime.  Please consider reducing 70/30 Insulin slightly to 18 units bid with meals      --Will follow patient during hospitalization--  Wyn Quaker RN, MSN, CDE Diabetes Coordinator Inpatient Glycemic Control Team Team Pager: [phone] (8a-5p)

## 2016-01-30 ENCOUNTER — Other Ambulatory Visit: Payer: Self-pay

## 2016-01-30 DIAGNOSIS — G4730 Sleep apnea, unspecified: Secondary | ICD-10-CM | POA: Insufficient documentation

## 2016-01-30 DIAGNOSIS — F32A Depression, unspecified: Secondary | ICD-10-CM | POA: Insufficient documentation

## 2016-01-30 DIAGNOSIS — F329 Major depressive disorder, single episode, unspecified: Secondary | ICD-10-CM | POA: Insufficient documentation

## 2016-01-30 DIAGNOSIS — E119 Type 2 diabetes mellitus without complications: Secondary | ICD-10-CM | POA: Insufficient documentation

## 2016-01-30 DIAGNOSIS — E782 Mixed hyperlipidemia: Secondary | ICD-10-CM | POA: Insufficient documentation

## 2016-01-31 ENCOUNTER — Encounter: Payer: Self-pay | Admitting: Surgery

## 2016-01-31 ENCOUNTER — Ambulatory Visit (INDEPENDENT_AMBULATORY_CARE_PROVIDER_SITE_OTHER): Payer: Medicare Other | Admitting: Surgery

## 2016-01-31 ENCOUNTER — Other Ambulatory Visit: Payer: Self-pay

## 2016-01-31 VITALS — BP 131/75 | HR 88 | Temp 99.0°F | Ht 66.0 in | Wt 177.0 lb

## 2016-01-31 DIAGNOSIS — Z09 Encounter for follow-up examination after completed treatment for conditions other than malignant neoplasm: Secondary | ICD-10-CM

## 2016-01-31 NOTE — Patient Instructions (Signed)

## 2016-01-31 NOTE — Progress Notes (Signed)
POD # 6 s/p E lap sb resection for perf Meckel's Doing well Some maroon stools, no evidence of active bleeding Taking PO Path d/w pt in detail Off narcotics  PE NAD Abd: incision c/d/i, small seroma around umbilicus where previous mesh was, no infection. Mild erythema from staples  A/p doing well F/U 10 days for staple removal If Maroon stool persist will need upper and lower scopes

## 2016-02-11 ENCOUNTER — Ambulatory Visit (INDEPENDENT_AMBULATORY_CARE_PROVIDER_SITE_OTHER): Payer: Medicare Other | Admitting: Surgery

## 2016-02-11 ENCOUNTER — Encounter: Payer: Self-pay | Admitting: Surgery

## 2016-02-11 VITALS — BP 139/81 | HR 77 | Temp 98.0°F | Ht 66.0 in | Wt 172.0 lb

## 2016-02-11 DIAGNOSIS — Z09 Encounter for follow-up examination after completed treatment for conditions other than malignant neoplasm: Secondary | ICD-10-CM

## 2016-02-11 MED ORDER — AMOXICILLIN-POT CLAVULANATE 875-125 MG PO TABS: 1.0000 | ORAL_TABLET | Freq: Two times a day (BID) | ORAL | 0 refills | Status: AC

## 2016-02-11 MED ORDER — AMOXICILLIN-POT CLAVULANATE 875-125 MG PO TABS: 1.0000 | ORAL_TABLET | Freq: Two times a day (BID) | ORAL | 0 refills | Status: DC

## 2016-02-11 NOTE — Patient Instructions (Signed)
We have sent your medicine to the pharmacy. Please see your follow up appointment listed below. Please call our office if you have any questions or concerns.

## 2016-02-11 NOTE — Progress Notes (Signed)
S/p e lap for perf Meckel's Doing well Taking PO Normal BMs No complaints   PE NAD And: soft, NT, staples in place around umbilicus some erythema, staples removed. I placed a q tip and there was minimal clot expressed. No purulence.  A/P doing well Likely small seroma around where we removed the mesh.  We will place Monica Rice on short course A/B to prevent infection F/u w Dr. Azalee Course next week

## 2016-02-20 ENCOUNTER — Encounter: Payer: Self-pay | Admitting: Surgery

## 2016-02-20 ENCOUNTER — Other Ambulatory Visit: Payer: Self-pay

## 2016-02-21 ENCOUNTER — Ambulatory Visit (INDEPENDENT_AMBULATORY_CARE_PROVIDER_SITE_OTHER): Payer: Medicare Other | Admitting: Surgery

## 2016-02-21 ENCOUNTER — Encounter: Payer: Self-pay | Admitting: Surgery

## 2016-02-21 VITALS — BP 142/83 | HR 75 | Temp 98.0°F | Ht 66.0 in | Wt 170.0 lb

## 2016-02-21 DIAGNOSIS — Q430 Meckel's diverticulum (displaced) (hypertrophic): Secondary | ICD-10-CM

## 2016-02-21 NOTE — Patient Instructions (Signed)
Please call our office if you have questions or concerns.   

## 2016-02-21 NOTE — Progress Notes (Signed)
65 year old who had a perforated Meckel's diverticulum and required exploratory laparotomy and resection on August 25 with Dr. Dahlia Byes. Patient has been doing well since that time she's been having good BMs without any blood in them now. Patient had no nausea or vomiting and her appetite is getting better. Patient states her energy is getting better as well. Patient has had minimal drainage from the wound only in that one area had a small seroma last time. Patient has had no fevers or chills no nausea or vomiting and overall seems to be improving.  Vitals:   02/21/16 1003  BP: (!) 142/83  Pulse: 75  Temp: 98 F (36.7 C)   PE:  Gen: NAD Abd: soft, nontender, non distended, incision c/d/i, small piece of either stitch or fibrous material coming through incision, cut with scissors  A/P:  Patient is healing well after a large surgery. This area of previous seroma seems to be almost completely healed and small fibrous area or stitch was removed. This area appears like it will only have minimal drainage for a couple more days and then be completely healed. Did discuss with the patient that if she has any fever chills or redness or increased drainage in the area that she should give [place] a call otherwise she can follow-up when necessary

## 2016-03-25 ENCOUNTER — Other Ambulatory Visit: Payer: Self-pay | Admitting: Internal Medicine

## 2016-03-25 DIAGNOSIS — Z1231 Encounter for screening mammogram for malignant neoplasm of breast: Secondary | ICD-10-CM

## 2016-05-05 ENCOUNTER — Ambulatory Visit
Admission: RE | Admit: 2016-05-05 | Discharge: 2016-05-05 | Disposition: A | Discharge status: Home / Self Care | Payer: Medicare Other | Source: Ambulatory Visit | Attending: Internal Medicine | Admitting: Internal Medicine

## 2016-05-05 DIAGNOSIS — Z1231 Encounter for screening mammogram for malignant neoplasm of breast: Secondary | ICD-10-CM | POA: Diagnosis present

## 2016-05-05 DIAGNOSIS — N6489 Other specified disorders of breast: Secondary | ICD-10-CM | POA: Diagnosis not present

## 2016-05-05 HISTORY — DX: Malignant (primary) neoplasm, unspecified: C80.1

## 2017-01-07 ENCOUNTER — Ambulatory Visit: Payer: Medicare Other | Attending: Specialist

## 2017-01-07 DIAGNOSIS — G4733 Obstructive sleep apnea (adult) (pediatric): Secondary | ICD-10-CM | POA: Diagnosis not present

## 2017-02-20 ENCOUNTER — Encounter
Admission: RE | Admit: 2017-02-20 | Discharge: 2017-02-20 | Disposition: A | Discharge status: Home / Self Care | Payer: Medicare Other | Source: Ambulatory Visit | Attending: Orthopedic Surgery | Admitting: Orthopedic Surgery

## 2017-02-20 DIAGNOSIS — M5442 Lumbago with sciatica, left side: Secondary | ICD-10-CM | POA: Diagnosis not present

## 2017-02-20 DIAGNOSIS — M5441 Lumbago with sciatica, right side: Secondary | ICD-10-CM | POA: Insufficient documentation

## 2017-02-20 DIAGNOSIS — I10 Essential (primary) hypertension: Secondary | ICD-10-CM | POA: Insufficient documentation

## 2017-02-20 DIAGNOSIS — E782 Mixed hyperlipidemia: Secondary | ICD-10-CM | POA: Insufficient documentation

## 2017-02-20 DIAGNOSIS — Z01810 Encounter for preprocedural cardiovascular examination: Secondary | ICD-10-CM | POA: Insufficient documentation

## 2017-02-20 DIAGNOSIS — Z01812 Encounter for preprocedural laboratory examination: Secondary | ICD-10-CM | POA: Insufficient documentation

## 2017-02-20 DIAGNOSIS — Z96641 Presence of right artificial hip joint: Secondary | ICD-10-CM | POA: Insufficient documentation

## 2017-02-20 DIAGNOSIS — M5136 Other intervertebral disc degeneration, lumbar region: Secondary | ICD-10-CM | POA: Diagnosis not present

## 2017-02-20 DIAGNOSIS — E119 Type 2 diabetes mellitus without complications: Secondary | ICD-10-CM | POA: Diagnosis not present

## 2017-02-20 DIAGNOSIS — G8929 Other chronic pain: Secondary | ICD-10-CM | POA: Insufficient documentation

## 2017-02-20 HISTORY — DX: Anxiety disorder, unspecified: F41.9

## 2017-02-20 HISTORY — DX: Major depressive disorder, single episode, unspecified: F32.9

## 2017-02-20 HISTORY — DX: Depression, unspecified: F32.A

## 2017-02-20 HISTORY — DX: Adverse effect of unspecified anesthetic, initial encounter: T41.45XA

## 2017-02-20 HISTORY — DX: Unspecified glaucoma: H40.9

## 2017-02-20 HISTORY — DX: Other intervertebral disc degeneration, lumbar region: M51.36

## 2017-02-20 HISTORY — DX: Other complications of anesthesia, initial encounter: T88.59XA

## 2017-02-20 HISTORY — DX: Other intervertebral disc degeneration, lumbar region without mention of lumbar back pain or lower extremity pain: M51.369

## 2017-02-20 HISTORY — DX: Gastro-esophageal reflux disease without esophagitis: K21.9

## 2017-02-20 HISTORY — DX: Anemia, unspecified: D64.9

## 2017-02-20 HISTORY — DX: Fatty (change of) liver, not elsewhere classified: K76.0

## 2017-02-20 LAB — PROTIME-INR
INR: 1.04
Prothrombin Time: 13.5 seconds (ref 11.4–15.2)

## 2017-02-20 LAB — URINALYSIS, ROUTINE W REFLEX MICROSCOPIC
Bilirubin Urine: NEGATIVE
GLUCOSE, UA: NEGATIVE mg/dL
HGB URINE DIPSTICK: NEGATIVE
Ketones, ur: NEGATIVE mg/dL
NITRITE: NEGATIVE
Protein, ur: NEGATIVE mg/dL
SPECIFIC GRAVITY, URINE: 1.006 (ref 1.005–1.030)
pH: 6 (ref 5.0–8.0)

## 2017-02-20 LAB — CBC WITH DIFFERENTIAL/PLATELET
BASOS ABS: 0 10*3/uL (ref 0–0.1)
Basophils Relative: 1 %
EOS PCT: 2 %
Eosinophils Absolute: 0.1 10*3/uL (ref 0–0.7)
HCT: 40.1 % (ref 35.0–47.0)
Hemoglobin: 13.7 g/dL (ref 12.0–16.0)
LYMPHS PCT: 18 %
Lymphs Abs: 1 10*3/uL (ref 1.0–3.6)
MCH: 30.3 pg (ref 26.0–34.0)
MCHC: 34.1 g/dL (ref 32.0–36.0)
MCV: 88.7 fL (ref 80.0–100.0)
Monocytes Absolute: 0.4 10*3/uL (ref 0.2–0.9)
Monocytes Relative: 7 %
NEUTROS ABS: 3.9 10*3/uL (ref 1.4–6.5)
NEUTROS PCT: 72 %
PLATELETS: 169 10*3/uL (ref 150–440)
RBC: 4.52 MIL/uL (ref 3.80–5.20)
RDW: 13.4 % (ref 11.5–14.5)
WBC: 5.4 10*3/uL (ref 3.6–11.0)

## 2017-02-20 LAB — COMPREHENSIVE METABOLIC PANEL
ALT: 39 U/L (ref 14–54)
AST: 40 U/L (ref 15–41)
Albumin: 4.1 g/dL (ref 3.5–5.0)
Alkaline Phosphatase: 57 U/L (ref 38–126)
Anion gap: 7 (ref 5–15)
BUN: 12 mg/dL (ref 6–20)
CHLORIDE: 104 mmol/L (ref 101–111)
CO2: 30 mmol/L (ref 22–32)
CREATININE: 0.72 mg/dL (ref 0.44–1.00)
Calcium: 9.3 mg/dL (ref 8.9–10.3)
GFR calc Af Amer: >60 mL/min (ref >60)
Glucose, Bld: 142 mg/dL — ABNORMAL HIGH (ref 65–99)
Potassium: 4 mmol/L (ref 3.5–5.1)
SODIUM: 141 mmol/L (ref 135–145)
Total Bilirubin: 0.6 mg/dL (ref 0.3–1.2)
Total Protein: 7.2 g/dL (ref 6.5–8.1)

## 2017-02-20 LAB — HEMOGLOBIN A1C
Hgb A1c MFr Bld: 7.3 % — ABNORMAL HIGH (ref 4.8–5.6)
Mean Plasma Glucose: 162.81 mg/dL

## 2017-02-20 LAB — TYPE AND SCREEN
ABO/RH(D): B POS
Antibody Screen: NEGATIVE

## 2017-02-20 LAB — SURGICAL PCR SCREEN
MRSA, PCR: POSITIVE — AB
Staphylococcus aureus: POSITIVE — AB

## 2017-02-20 LAB — C-REACTIVE PROTEIN: CRP: <0.8 mg/dL (ref <1.0)

## 2017-02-20 LAB — SEDIMENTATION RATE: SED RATE: 8 mm/h (ref 0–30)

## 2017-02-20 LAB — APTT: aPTT: 27 seconds (ref 24–36)

## 2017-02-20 NOTE — Patient Instructions (Signed)
Your procedure is scheduled on: 03/11/17 Wed Report to Same Day Surgery 2nd floor medical mall [facility] Entrance-take elevator on left to 2nd floor.  Check in with surgery information desk.) To find out your arrival time please call [phone] between 1PM - 3PM on Tues 03/10/17  Remember: Instructions that are not followed completely may result in serious medical risk, up to and including death, or upon the discretion of your surgeon and anesthesiologist your surgery may need to be rescheduled.    _x___ 1. Do not eat food after midnight the night before your procedure. You may drink clear liquids up to 2 hours before you are scheduled to arrive at the hospital for your procedure.  Do not drink clear liquids within 2 hours of your scheduled arrival to the hospital.  Clear liquids include  --Water or Apple juice without pulp  --Clear carbohydrate beverage such as ClearFast or Gatorade  --Black Coffee or Clear Tea (No milk, no creamers, do not add anything to                  the coffee or Tea Type 1 and type 2 diabetics should only drink water.  No gum chewing or hard candies.     __x__ 2. No Alcohol for 24 hours before or after surgery.   __x__3. No Smoking for 24 prior to surgery.   ____  4. Bring all medications with you on the day of surgery if instructed.    __x__ 5. Notify your doctor if there is any change in your medical condition     (cold, fever, infections).     Do not wear jewelry, make-up, hairpins, clips or nail polish.  Do not wear lotions, powders, or perfumes. You may wear deodorant.  Do not shave 48 hours prior to surgery. Men may shave face and neck.  Do not bring valuables to the hospital.    [facility] is not responsible for any belongings or valuables.               Contacts, dentures or bridgework may not be worn into surgery.  Leave your suitcase in the car. After surgery it may be brought to your room.  For patients admitted to the hospital, discharge  time is determined by your                       treatment team.   Patients discharged the day of surgery will not be allowed to drive home.  You will need someone to drive you home and stay with you the night of your procedure.    Please read over the following fact sheets that you were given:   [facility] Preparing for Surgery and or MRSA Information   _x___ Take anti-hypertensive listed below, cardiac, seizure, asthma,     anti-reflux and psychiatric medicines. These include:  1. clonazePAM (KLONOPIN) 0.5   2.FLUoxetine (PROZAC) 20 MG  3.omeprazole (PRILOSEC) 20 MG capsule  4.  5.  6.  ____Fleets enema or Magnesium Citrate as directed.   _x___ Use CHG Soap or sage wipes as directed on instruction sheet   ____ Use inhalers on the day of surgery and bring to hospital day of surgery  __x__ Stop Metformin and Janumet 2 days prior to surgery.    __x__ Take 1/2 of usual insulin dose the night before surgery and none on the morning     surgery.   _x___ Follow recommendations from Cardiologist, Pulmonologist or  PCP regarding          stopping Aspirin, Coumadin, Plavix ,Eliquis, Effient, or Pradaxa, and Pletal.  X____Stop Anti-inflammatories such as Advil, Aleve, Ibuprofen, Motrin, Naproxen, Naprosyn, Goodies powders or aspirin products. Stop Mobic 1 week before surgery  OK to take Tylenol and                          Celebrex.   _x___ Stop supplements until after surgery.  But may continue Vitamin D, Vitamin B,       and multivitamin.   ____ Bring C-Pap to the hospital.

## 2017-02-20 NOTE — Pre-Procedure Instructions (Signed)
HgbA1C results sent to Dr. Hooten for review. 

## 2017-02-23 NOTE — Pre-Procedure Instructions (Signed)
EKG OK'ED BY DR Randa Lynn

## 2017-02-24 ENCOUNTER — Inpatient Hospital Stay: Admission: RE | Admit: 2017-02-24 | Payer: Medicare Other | Source: Ambulatory Visit

## 2017-03-10 MED ORDER — CEFAZOLIN SODIUM-DEXTROSE 2-4 GM/100ML-% IV SOLN: 2.0000 g | INTRAVENOUS | Status: DC

## 2017-03-10 MED ORDER — TRANEXAMIC ACID 1000 MG/10ML IV SOLN
1000.0000 mg | INTRAVENOUS | Status: DC
  Filled 2017-03-10: qty 10

## 2017-03-11 ENCOUNTER — Inpatient Hospital Stay: Payer: Medicare Other | Admitting: Anesthesiology

## 2017-03-11 ENCOUNTER — Inpatient Hospital Stay
Admission: RE | Admit: 2017-03-11 | Discharge: 2017-03-13 | DRG: 470 | Disposition: A | Discharge status: Home Health | Payer: Medicare Other | Source: Ambulatory Visit | Attending: Orthopedic Surgery | Admitting: Orthopedic Surgery

## 2017-03-11 ENCOUNTER — Inpatient Hospital Stay: Payer: Medicare Other

## 2017-03-11 ENCOUNTER — Encounter
Admission: RE | Disposition: A | Discharge status: Home Health | Payer: Self-pay | Source: Ambulatory Visit | Attending: Orthopedic Surgery

## 2017-03-11 ENCOUNTER — Encounter: Payer: Self-pay | Admitting: Orthopedic Surgery

## 2017-03-11 DIAGNOSIS — Z96641 Presence of right artificial hip joint: Secondary | ICD-10-CM | POA: Diagnosis present

## 2017-03-11 DIAGNOSIS — Z85828 Personal history of other malignant neoplasm of skin: Secondary | ICD-10-CM

## 2017-03-11 DIAGNOSIS — Z8261 Family history of arthritis: Secondary | ICD-10-CM | POA: Diagnosis not present

## 2017-03-11 DIAGNOSIS — Z7951 Long term (current) use of inhaled steroids: Secondary | ICD-10-CM | POA: Diagnosis not present

## 2017-03-11 DIAGNOSIS — I10 Essential (primary) hypertension: Secondary | ICD-10-CM | POA: Diagnosis present

## 2017-03-11 DIAGNOSIS — Z9841 Cataract extraction status, right eye: Secondary | ICD-10-CM

## 2017-03-11 DIAGNOSIS — Z841 Family history of disorders of kidney and ureter: Secondary | ICD-10-CM | POA: Diagnosis not present

## 2017-03-11 DIAGNOSIS — Z9049 Acquired absence of other specified parts of digestive tract: Secondary | ICD-10-CM

## 2017-03-11 DIAGNOSIS — H409 Unspecified glaucoma: Secondary | ICD-10-CM | POA: Diagnosis present

## 2017-03-11 DIAGNOSIS — K219 Gastro-esophageal reflux disease without esophagitis: Secondary | ICD-10-CM | POA: Diagnosis present

## 2017-03-11 DIAGNOSIS — K760 Fatty (change of) liver, not elsewhere classified: Secondary | ICD-10-CM | POA: Diagnosis present

## 2017-03-11 DIAGNOSIS — M1612 Unilateral primary osteoarthritis, left hip: Principal | ICD-10-CM | POA: Diagnosis present

## 2017-03-11 DIAGNOSIS — M5136 Other intervertebral disc degeneration, lumbar region: Secondary | ICD-10-CM | POA: Diagnosis present

## 2017-03-11 DIAGNOSIS — M25552 Pain in left hip: Secondary | ICD-10-CM | POA: Diagnosis present

## 2017-03-11 DIAGNOSIS — Z882 Allergy status to sulfonamides status: Secondary | ICD-10-CM | POA: Diagnosis not present

## 2017-03-11 DIAGNOSIS — Z794 Long term (current) use of insulin: Secondary | ICD-10-CM | POA: Diagnosis not present

## 2017-03-11 DIAGNOSIS — Z8249 Family history of ischemic heart disease and other diseases of the circulatory system: Secondary | ICD-10-CM | POA: Diagnosis not present

## 2017-03-11 DIAGNOSIS — E785 Hyperlipidemia, unspecified: Secondary | ICD-10-CM | POA: Diagnosis present

## 2017-03-11 DIAGNOSIS — Z96642 Presence of left artificial hip joint: Secondary | ICD-10-CM

## 2017-03-11 DIAGNOSIS — Z7982 Long term (current) use of aspirin: Secondary | ICD-10-CM

## 2017-03-11 DIAGNOSIS — Z96649 Presence of unspecified artificial hip joint: Secondary | ICD-10-CM

## 2017-03-11 DIAGNOSIS — F329 Major depressive disorder, single episode, unspecified: Secondary | ICD-10-CM | POA: Diagnosis present

## 2017-03-11 DIAGNOSIS — E119 Type 2 diabetes mellitus without complications: Secondary | ICD-10-CM | POA: Diagnosis present

## 2017-03-11 DIAGNOSIS — F419 Anxiety disorder, unspecified: Secondary | ICD-10-CM | POA: Diagnosis present

## 2017-03-11 DIAGNOSIS — Z9842 Cataract extraction status, left eye: Secondary | ICD-10-CM | POA: Diagnosis not present

## 2017-03-11 DIAGNOSIS — G4730 Sleep apnea, unspecified: Secondary | ICD-10-CM | POA: Diagnosis present

## 2017-03-11 DIAGNOSIS — Z888 Allergy status to other drugs, medicaments and biological substances status: Secondary | ICD-10-CM

## 2017-03-11 HISTORY — PX: TOTAL HIP ARTHROPLASTY: SHX124

## 2017-03-11 LAB — GLUCOSE, CAPILLARY
GLUCOSE-CAPILLARY: 163 mg/dL — AB (ref 65–99)
GLUCOSE-CAPILLARY: 138 mg/dL — AB (ref 65–99)
Glucose-Capillary: 140 mg/dL — ABNORMAL HIGH (ref 65–99)
Glucose-Capillary: 102 mg/dL — ABNORMAL HIGH (ref 65–99)
Glucose-Capillary: 280 mg/dL — ABNORMAL HIGH (ref 65–99)
Glucose-Capillary: 257 mg/dL — ABNORMAL HIGH (ref 65–99)

## 2017-03-11 LAB — TYPE AND SCREEN
ABO/RH(D): B POS
Antibody Screen: NEGATIVE

## 2017-03-11 SURGERY — ARTHROPLASTY, HIP, TOTAL,POSTERIOR APPROACH
Anesthesia: Spinal | Laterality: Left

## 2017-03-11 MED ORDER — MENTHOL 3 MG MT LOZG
1.0000 | LOZENGE | OROMUCOSAL | Status: DC | PRN
  Filled 2017-03-11: qty 9

## 2017-03-11 MED ORDER — ENOXAPARIN SODIUM 40 MG/0.4ML ~~LOC~~ SOLN
40.0000 mg | SUBCUTANEOUS | Status: DC
  Administered 2017-03-12 – 2017-03-13 (×2): 40 mg via SUBCUTANEOUS
  Filled 2017-03-11 (×2): qty 0.4

## 2017-03-11 MED ORDER — PROPOFOL 10 MG/ML IV BOLUS
INTRAVENOUS | Status: DC | PRN
  Administered 2017-03-11 (×2): 14 mg via INTRAVENOUS

## 2017-03-11 MED ORDER — TRANEXAMIC ACID 1000 MG/10ML IV SOLN
INTRAVENOUS | Status: DC | PRN
  Administered 2017-03-11: 1000 mg via INTRAVENOUS

## 2017-03-11 MED ORDER — SODIUM CHLORIDE 0.9 % IV SOLN
INTRAVENOUS | Status: DC
  Administered 2017-03-11 (×2): via INTRAVENOUS

## 2017-03-11 MED ORDER — ADULT MULTIVITAMIN W/MINERALS CH
1.0000 | ORAL_TABLET | Freq: Every day | ORAL | Status: DC
  Administered 2017-03-12 – 2017-03-13 (×2): 1 via ORAL
  Filled 2017-03-11 (×2): qty 1

## 2017-03-11 MED ORDER — MIDAZOLAM HCL 5 MG/5ML IJ SOLN
INTRAMUSCULAR | Status: DC | PRN
  Administered 2017-03-11: 2 mg via INTRAVENOUS

## 2017-03-11 MED ORDER — PROPOFOL 500 MG/50ML IV EMUL
INTRAVENOUS | Status: AC
  Filled 2017-03-11: qty 50

## 2017-03-11 MED ORDER — CEFAZOLIN SODIUM-DEXTROSE 2-3 GM-% IV SOLR
INTRAVENOUS | Status: DC | PRN
  Administered 2017-03-11: 2 g via INTRAVENOUS

## 2017-03-11 MED ORDER — TRANEXAMIC ACID 1000 MG/10ML IV SOLN
1000.0000 mg | Freq: Once | INTRAVENOUS | Status: AC
  Administered 2017-03-11: 1000 mg via INTRAVENOUS
  Filled 2017-03-11: qty 10

## 2017-03-11 MED ORDER — FENTANYL CITRATE (PF) 100 MCG/2ML IJ SOLN: 25.0000 ug | INTRAMUSCULAR | Status: DC | PRN

## 2017-03-11 MED ORDER — CLONAZEPAM 0.5 MG PO TABS: 0.5000 mg | ORAL_TABLET | Freq: Every day | ORAL | Status: DC | PRN

## 2017-03-11 MED ORDER — ACETAMINOPHEN 650 MG RE SUPP: 650.0000 mg | Freq: Four times a day (QID) | RECTAL | Status: DC | PRN

## 2017-03-11 MED ORDER — CEFAZOLIN SODIUM-DEXTROSE 2-4 GM/100ML-% IV SOLN
2.0000 g | Freq: Four times a day (QID) | INTRAVENOUS | Status: DC
  Filled 2017-03-11 (×4): qty 100

## 2017-03-11 MED ORDER — SODIUM CHLORIDE 0.9 % IV SOLN
INTRAVENOUS | Status: DC
  Administered 2017-03-11 (×2): via INTRAVENOUS

## 2017-03-11 MED ORDER — METOCLOPRAMIDE HCL 10 MG PO TABS
10.0000 mg | ORAL_TABLET | Freq: Three times a day (TID) | ORAL | Status: DC
  Administered 2017-03-11 – 2017-03-13 (×7): 10 mg via ORAL
  Filled 2017-03-11 (×7): qty 1

## 2017-03-11 MED ORDER — VANCOMYCIN HCL IN DEXTROSE 1-5 GM/200ML-% IV SOLN
1000.0000 mg | Freq: Once | INTRAVENOUS | Status: AC
  Administered 2017-03-11: 1000 mg via INTRAVENOUS

## 2017-03-11 MED ORDER — OXYCODONE HCL 5 MG/5ML PO SOLN: 5.0000 mg | Freq: Once | ORAL | Status: DC | PRN

## 2017-03-11 MED ORDER — ATORVASTATIN CALCIUM 20 MG PO TABS
40.0000 mg | ORAL_TABLET | Freq: Every day | ORAL | Status: DC
  Administered 2017-03-11 – 2017-03-12 (×2): 40 mg via ORAL
  Filled 2017-03-11 (×2): qty 2

## 2017-03-11 MED ORDER — DEXAMETHASONE SODIUM PHOSPHATE 4 MG/ML IJ SOLN
INTRAMUSCULAR | Status: DC | PRN
  Administered 2017-03-11: 5 mg via INTRAVENOUS

## 2017-03-11 MED ORDER — NEOMYCIN-POLYMYXIN B GU 40-200000 IR SOLN
Status: DC | PRN
  Administered 2017-03-11: 2 mL

## 2017-03-11 MED ORDER — LIDOCAINE HCL (PF) 2 % IJ SOLN
INTRAMUSCULAR | Status: AC
  Filled 2017-03-11: qty 10

## 2017-03-11 MED ORDER — ACETAMINOPHEN 10 MG/ML IV SOLN
INTRAVENOUS | Status: DC | PRN
  Administered 2017-03-11: 1000 mg via INTRAVENOUS

## 2017-03-11 MED ORDER — DIPHENHYDRAMINE HCL 12.5 MG/5ML PO ELIX: 12.5000 mg | ORAL_SOLUTION | ORAL | Status: DC | PRN

## 2017-03-11 MED ORDER — CALCIUM CARBONATE-VITAMIN D 500-200 MG-UNIT PO TABS
1.0000 | ORAL_TABLET | Freq: Every day | ORAL | Status: DC
  Administered 2017-03-12 – 2017-03-13 (×2): 1 via ORAL
  Filled 2017-03-11 (×2): qty 1

## 2017-03-11 MED ORDER — FENTANYL CITRATE (PF) 100 MCG/2ML IJ SOLN
INTRAMUSCULAR | Status: AC
  Filled 2017-03-11: qty 2

## 2017-03-11 MED ORDER — INSULIN ASPART PROT & ASPART (70-30 MIX) 100 UNIT/ML ~~LOC~~ SUSP
20.0000 [IU] | Freq: Every day | SUBCUTANEOUS | Status: DC
  Administered 2017-03-12 – 2017-03-13 (×2): 20 [IU] via SUBCUTANEOUS
  Filled 2017-03-11 (×3): qty 10

## 2017-03-11 MED ORDER — NEOMYCIN-POLYMYXIN B GU 40-200000 IR SOLN
Status: AC
  Filled 2017-03-11: qty 20

## 2017-03-11 MED ORDER — PHENOL 1.4 % MT LIQD
1.0000 | OROMUCOSAL | Status: DC | PRN
  Filled 2017-03-11: qty 177

## 2017-03-11 MED ORDER — PROPOFOL 500 MG/50ML IV EMUL
INTRAVENOUS | Status: DC | PRN
  Administered 2017-03-11: 80 ug/kg/min via INTRAVENOUS

## 2017-03-11 MED ORDER — ACETAMINOPHEN 325 MG PO TABS
650.0000 mg | ORAL_TABLET | Freq: Four times a day (QID) | ORAL | Status: DC | PRN
  Administered 2017-03-13: 650 mg via ORAL
  Filled 2017-03-11: qty 2

## 2017-03-11 MED ORDER — HYDROCHLOROTHIAZIDE 25 MG PO TABS
25.0000 mg | ORAL_TABLET | Freq: Every day | ORAL | Status: DC
  Administered 2017-03-12 – 2017-03-13 (×2): 25 mg via ORAL
  Filled 2017-03-11 (×2): qty 1

## 2017-03-11 MED ORDER — ACETAMINOPHEN 10 MG/ML IV SOLN
1000.0000 mg | Freq: Four times a day (QID) | INTRAVENOUS | Status: AC
  Administered 2017-03-11 – 2017-03-12 (×4): 1000 mg via INTRAVENOUS
  Filled 2017-03-11 (×4): qty 100

## 2017-03-11 MED ORDER — DEXTROSE 5 % IV SOLN
Freq: Four times a day (QID) | INTRAVENOUS | Status: AC
  Administered 2017-03-11 – 2017-03-12 (×4): via INTRAVENOUS
  Filled 2017-03-11 (×4): qty 20

## 2017-03-11 MED ORDER — MAGNESIUM HYDROXIDE 400 MG/5ML PO SUSP
30.0000 mL | Freq: Every day | ORAL | Status: DC | PRN
  Administered 2017-03-13: 30 mL via ORAL
  Filled 2017-03-11: qty 30

## 2017-03-11 MED ORDER — FLUTICASONE PROPIONATE 50 MCG/ACT NA SUSP
2.0000 | Freq: Every day | NASAL | Status: DC | PRN
  Filled 2017-03-11: qty 16

## 2017-03-11 MED ORDER — VITAMIN B-12 1000 MCG PO TABS
1000.0000 ug | ORAL_TABLET | Freq: Every day | ORAL | Status: DC
  Administered 2017-03-12 – 2017-03-13 (×2): 1000 ug via ORAL
  Filled 2017-03-11 (×2): qty 1

## 2017-03-11 MED ORDER — OXYCODONE HCL 5 MG PO TABS
5.0000 mg | ORAL_TABLET | ORAL | Status: DC | PRN
  Administered 2017-03-11 – 2017-03-12 (×3): 5 mg via ORAL
  Administered 2017-03-12: 10 mg via ORAL
  Administered 2017-03-12: 5 mg via ORAL
  Administered 2017-03-12: 10 mg via ORAL
  Administered 2017-03-12: 5 mg via ORAL
  Filled 2017-03-11 (×5): qty 1
  Filled 2017-03-11 (×2): qty 2

## 2017-03-11 MED ORDER — FLEET ENEMA 7-19 GM/118ML RE ENEM: 1.0000 | ENEMA | Freq: Once | RECTAL | Status: DC | PRN

## 2017-03-11 MED ORDER — ALUM & MAG HYDROXIDE-SIMETH 200-200-20 MG/5ML PO SUSP: 30.0000 mL | ORAL | Status: DC | PRN

## 2017-03-11 MED ORDER — CHLORHEXIDINE GLUCONATE 4 % EX LIQD: 60.0000 mL | Freq: Once | CUTANEOUS | Status: DC

## 2017-03-11 MED ORDER — FENTANYL CITRATE (PF) 100 MCG/2ML IJ SOLN
INTRAMUSCULAR | Status: DC | PRN
  Administered 2017-03-11: 50 ug via INTRAVENOUS

## 2017-03-11 MED ORDER — MORPHINE SULFATE (PF) 2 MG/ML IV SOLN: 2.0000 mg | INTRAVENOUS | Status: DC | PRN

## 2017-03-11 MED ORDER — ONDANSETRON HCL 4 MG/2ML IJ SOLN: 4.0000 mg | Freq: Four times a day (QID) | INTRAMUSCULAR | Status: DC | PRN

## 2017-03-11 MED ORDER — BUPIVACAINE HCL (PF) 0.5 % IJ SOLN
INTRAMUSCULAR | Status: DC | PRN
  Administered 2017-03-11: 2.5 mL

## 2017-03-11 MED ORDER — BUPROPION HCL ER (XL) 150 MG PO TB24
150.0000 mg | ORAL_TABLET | Freq: Every day | ORAL | Status: DC
  Administered 2017-03-11 – 2017-03-12 (×2): 150 mg via ORAL
  Filled 2017-03-11 (×2): qty 1

## 2017-03-11 MED ORDER — SENNOSIDES-DOCUSATE SODIUM 8.6-50 MG PO TABS
1.0000 | ORAL_TABLET | Freq: Two times a day (BID) | ORAL | Status: DC
  Administered 2017-03-11 – 2017-03-13 (×4): 1 via ORAL
  Filled 2017-03-11 (×4): qty 1

## 2017-03-11 MED ORDER — BISACODYL 10 MG RE SUPP
10.0000 mg | Freq: Every day | RECTAL | Status: DC | PRN
  Administered 2017-03-13: 10 mg via RECTAL
  Filled 2017-03-11: qty 1

## 2017-03-11 MED ORDER — TRAMADOL HCL 50 MG PO TABS
50.0000 mg | ORAL_TABLET | ORAL | Status: DC | PRN
  Administered 2017-03-11: 50 mg via ORAL
  Administered 2017-03-12: 100 mg via ORAL
  Filled 2017-03-11: qty 2
  Filled 2017-03-11: qty 1

## 2017-03-11 MED ORDER — FERROUS SULFATE 325 (65 FE) MG PO TABS: 325.0000 mg | ORAL_TABLET | Freq: Every day | ORAL | Status: DC

## 2017-03-11 MED ORDER — LATANOPROST 0.005 % OP SOLN
1.0000 [drp] | Freq: Every day | OPHTHALMIC | Status: DC
  Administered 2017-03-11 – 2017-03-12 (×2): 1 [drp] via OPHTHALMIC
  Filled 2017-03-11: qty 2.5

## 2017-03-11 MED ORDER — SODIUM CHLORIDE 0.9 % IV SOLN
INTRAVENOUS | Status: DC | PRN
  Administered 2017-03-11: 30 ug/min via INTRAVENOUS

## 2017-03-11 MED ORDER — INSULIN ASPART PROT & ASPART (70-30 MIX) 100 UNIT/ML ~~LOC~~ SUSP: 60.0000 [IU] | Freq: Every day | SUBCUTANEOUS | Status: DC

## 2017-03-11 MED ORDER — TETRACAINE HCL 1 % IJ SOLN
INTRAMUSCULAR | Status: DC | PRN
  Administered 2017-03-11: 5 mg via INTRASPINAL

## 2017-03-11 MED ORDER — PANTOPRAZOLE SODIUM 40 MG PO TBEC
40.0000 mg | DELAYED_RELEASE_TABLET | Freq: Two times a day (BID) | ORAL | Status: DC
  Administered 2017-03-11 – 2017-03-13 (×4): 40 mg via ORAL
  Filled 2017-03-11 (×4): qty 1

## 2017-03-11 MED ORDER — ONDANSETRON HCL 4 MG PO TABS: 4.0000 mg | ORAL_TABLET | Freq: Four times a day (QID) | ORAL | Status: DC | PRN

## 2017-03-11 MED ORDER — FLUOXETINE HCL 20 MG PO CAPS
40.0000 mg | ORAL_CAPSULE | Freq: Every day | ORAL | Status: DC
  Administered 2017-03-12 – 2017-03-13 (×2): 40 mg via ORAL
  Filled 2017-03-11 (×2): qty 2

## 2017-03-11 MED ORDER — HAIR/SKIN/NAILS PO TABS: 1.0000 | ORAL_TABLET | Freq: Two times a day (BID) | ORAL | Status: DC

## 2017-03-11 MED ORDER — AMLODIPINE BESYLATE 5 MG PO TABS
5.0000 mg | ORAL_TABLET | Freq: Every day | ORAL | Status: DC
Start: 2017-03-12 — End: 2017-03-13
  Administered 2017-03-12 – 2017-03-13 (×2): 5 mg via ORAL
  Filled 2017-03-11 (×2): qty 1

## 2017-03-11 MED ORDER — GABAPENTIN 100 MG PO CAPS
100.0000 mg | ORAL_CAPSULE | Freq: Every day | ORAL | Status: DC
  Administered 2017-03-11 – 2017-03-12 (×2): 100 mg via ORAL
  Filled 2017-03-11 (×2): qty 1

## 2017-03-11 MED ORDER — OXYCODONE HCL 5 MG PO TABS: 5.0000 mg | ORAL_TABLET | Freq: Once | ORAL | Status: DC | PRN

## 2017-03-11 MED ORDER — FERROUS SULFATE 325 (65 FE) MG PO TABS
325.0000 mg | ORAL_TABLET | Freq: Two times a day (BID) | ORAL | Status: DC
  Administered 2017-03-12 – 2017-03-13 (×3): 325 mg via ORAL
  Filled 2017-03-11 (×3): qty 1

## 2017-03-11 MED ORDER — INSULIN ASPART 100 UNIT/ML ~~LOC~~ SOLN
0.0000 [IU] | Freq: Three times a day (TID) | SUBCUTANEOUS | Status: DC
  Administered 2017-03-12: 3 [IU] via SUBCUTANEOUS
  Administered 2017-03-12: 5 [IU] via SUBCUTANEOUS
  Administered 2017-03-12: 3 [IU] via SUBCUTANEOUS
  Administered 2017-03-13: 5 [IU] via SUBCUTANEOUS
  Administered 2017-03-13: 8 [IU] via SUBCUTANEOUS
  Filled 2017-03-11 (×4): qty 1

## 2017-03-11 MED ORDER — METFORMIN HCL 500 MG PO TABS
1000.0000 mg | ORAL_TABLET | Freq: Two times a day (BID) | ORAL | Status: DC
  Administered 2017-03-12 – 2017-03-13 (×2): 1000 mg via ORAL
  Filled 2017-03-11 (×4): qty 2

## 2017-03-11 MED ORDER — ACETAMINOPHEN 10 MG/ML IV SOLN
INTRAVENOUS | Status: AC
  Filled 2017-03-11: qty 100

## 2017-03-11 MED ORDER — MIDAZOLAM HCL 2 MG/2ML IJ SOLN
INTRAMUSCULAR | Status: AC
  Filled 2017-03-11: qty 2

## 2017-03-11 SURGICAL SUPPLY — 52 items
BLADE DRUM FLTD (BLADE) ×3 IMPLANT
BLADE SAW 1 (BLADE) ×3 IMPLANT
CANISTER SUCT 1200ML W/VALVE (MISCELLANEOUS) ×3 IMPLANT
CANISTER SUCT 3000ML PPV (MISCELLANEOUS) ×6 IMPLANT
CAPT HIP TOTAL 2 ×3 IMPLANT
CARTRIDGE OIL MAESTRO DRILL (MISCELLANEOUS) ×1 IMPLANT
CATH FOL LEG HOLDER (MISCELLANEOUS) ×3 IMPLANT
CATH TRAY METER 16FR LF (MISCELLANEOUS) ×3 IMPLANT
DIFFUSER MAESTRO (MISCELLANEOUS) ×3 IMPLANT
DRAPE INCISE IOBAN 66X60 STRL (DRAPES) ×3 IMPLANT
DRAPE SHEET LG 3/4 BI-LAMINATE (DRAPES) ×3 IMPLANT
DRSG DERMACEA 8X12 NADH (GAUZE/BANDAGES/DRESSINGS) ×3 IMPLANT
DRSG OPSITE POSTOP 4X12 (GAUZE/BANDAGES/DRESSINGS) ×3 IMPLANT
DRSG OPSITE POSTOP 4X14 (GAUZE/BANDAGES/DRESSINGS) IMPLANT
DRSG TEGADERM 4X4.75 (GAUZE/BANDAGES/DRESSINGS) ×3 IMPLANT
DURAPREP 26ML APPLICATOR (WOUND CARE) ×3 IMPLANT
ELECT BLADE 6.5 EXT (BLADE) ×3 IMPLANT
ELECT CAUTERY BLADE 6.4 (BLADE) ×3 IMPLANT
EVACUATOR 1/8 PVC DRAIN (DRAIN) ×3 IMPLANT
GLOVE BIOGEL M STRL SZ7.5 (GLOVE) ×6 IMPLANT
GLOVE BIOGEL PI IND STRL 9 (GLOVE) ×1 IMPLANT
GLOVE BIOGEL PI INDICATOR 9 (GLOVE) ×2
GLOVE INDICATOR 8.0 STRL GRN (GLOVE) ×3 IMPLANT
GLOVE SURG SYN 9.0  PF PI (GLOVE) ×2
GLOVE SURG SYN 9.0 PF PI (GLOVE) ×1 IMPLANT
GOWN STRL REUS W/ TWL LRG LVL3 (GOWN DISPOSABLE) ×2 IMPLANT
GOWN STRL REUS W/TWL 2XL LVL3 (GOWN DISPOSABLE) ×3 IMPLANT
GOWN STRL REUS W/TWL LRG LVL3 (GOWN DISPOSABLE) ×4
HOOD PEEL AWAY FLYTE STAYCOOL (MISCELLANEOUS) ×6 IMPLANT
KIT RM TURNOVER STRD PROC AR (KITS) ×3 IMPLANT
NDL SAFETY 18GX1.5 (NEEDLE) ×3 IMPLANT
NS IRRIG 500ML POUR BTL (IV SOLUTION) ×3 IMPLANT
OIL CARTRIDGE MAESTRO DRILL (MISCELLANEOUS) ×3
PACK HIP PROSTHESIS (MISCELLANEOUS) ×3 IMPLANT
PIN STEIN THRED 5/32 (Pin) ×3 IMPLANT
PULSAVAC PLUS IRRIG FAN TIP (DISPOSABLE) ×3
SOL .9 NS 3000ML IRR  AL (IV SOLUTION) ×2
SOL .9 NS 3000ML IRR UROMATIC (IV SOLUTION) ×1 IMPLANT
SOL PREP PVP 2OZ (MISCELLANEOUS) ×3
SOLUTION PREP PVP 2OZ (MISCELLANEOUS) ×1 IMPLANT
SPONGE DRAIN TRACH 4X4 STRL 2S (GAUZE/BANDAGES/DRESSINGS) ×3 IMPLANT
STAPLER SKIN PROX 35W (STAPLE) ×3 IMPLANT
SUT ETHIBOND #5 BRAIDED 30INL (SUTURE) ×3 IMPLANT
SUT VIC AB 0 CT1 36 (SUTURE) ×3 IMPLANT
SUT VIC AB 1 CT1 36 (SUTURE) ×6 IMPLANT
SUT VIC AB 2-0 CT1 27 (SUTURE) ×2
SUT VIC AB 2-0 CT1 TAPERPNT 27 (SUTURE) ×1 IMPLANT
SYR 20CC LL (SYRINGE) ×3 IMPLANT
TAPE ADH 3 LX (MISCELLANEOUS) ×3 IMPLANT
TAPE TRANSPORE STRL 2 31045 (GAUZE/BANDAGES/DRESSINGS) ×3 IMPLANT
TIP FAN IRRIG PULSAVAC PLUS (DISPOSABLE) ×1 IMPLANT
TOWEL OR 17X26 4PK STRL BLUE (TOWEL DISPOSABLE) ×3 IMPLANT

## 2017-03-11 NOTE — Anesthesia Preprocedure Evaluation (Signed)
Anesthesia Evaluation  Patient identified by MRN, date of birth, ID band Patient awake    Reviewed: Allergy & Precautions, H&P , NPO status , Patient's Chart, lab work & pertinent test results  History of Anesthesia Complications (+) DIFFICULT AIRWAY, PROLONGED EMERGENCE and history of anesthetic complications  Airway Mallampati: III  TM Distance: <3 FB Neck ROM: full    Dental  (+) Poor Dentition, Chipped, Missing, Caps   Pulmonary neg shortness of breath, sleep apnea ,           Cardiovascular Exercise Tolerance: Good hypertension, (-) angina+ Peripheral Vascular Disease  (-) Past MI and (-) DOE      Neuro/Psych PSYCHIATRIC DISORDERS Anxiety Depression  Neuromuscular disease    GI/Hepatic Neg liver ROS, GERD  Controlled and Medicated,  Endo/Other  diabetes, Type 2  Renal/GU      Musculoskeletal  (+) Arthritis ,   Abdominal   Peds  Hematology negative hematology ROS (+)   Anesthesia Other Findings Past Medical History: No date: Anemia No date: Anxiety No date: Cancer [facility])     Comment:  skin ca No date: Complication of anesthesia     Comment:  "difficult to wake up" No date: DDD (degenerative disc disease), lumbar No date: Depression No date: Diabetes mellitus without complication (HCC) No date: Difficult intubation No date: Fatty liver No date: GERD (gastroesophageal reflux disease) No date: Glaucoma No date: Hypertension  Past Surgical History: No date: APPENDECTOMY No date: CARPAL TUNNEL RELEASE; Bilateral No date: CATARACT EXTRACTION, BILATERAL No date: CESAREAN SECTION     Comment:  x2  No date: CHOLECYSTECTOMY No date: JOINT REPLACEMENT; Right 01/25/2016: LAPAROSCOPY; N/A     Comment:  Procedure: LAPAROSCOPY DIAGNOSTIC;  Surgeon: Jules Husbands, MD;  Location: [facility] ORS;  Service: General;                Laterality: N/A; 01/25/2016: LAPAROTOMY; N/A     Comment:  Procedure:  EXPLORATORY LAPAROTOMY;  Surgeon: Jules Husbands, MD;  Location: [facility] ORS;  Service: General;                Laterality: N/A; No date: tumor rt foot; Right No date: varicose  BMI    Body Mass Index:  27.96 kg/m      Reproductive/Obstetrics negative OB ROS                             Anesthesia Physical Anesthesia Plan  ASA: III  Anesthesia Plan: Spinal   Post-op Pain Management:    Induction:   PONV Risk Score and Plan: Propofol infusion  Airway Management Planned: Natural Airway and Nasal Cannula  Additional Equipment:   Intra-op Plan:   Post-operative Plan:   Informed Consent: I have reviewed the patients History and Physical, chart, labs and discussed the procedure including the risks, benefits and alternatives for the proposed anesthesia with the patient or authorized representative who has indicated his/her understanding and acceptance.   Dental Advisory Given  Plan Discussed with: Anesthesiologist, CRNA and Surgeon  Anesthesia Plan Comments: (Patient reports no bleeding problems and no anticoagulant use.  Plan for spinal with backup GA  Patient consented for risks of anesthesia including but not limited to:  - adverse reactions to medications - risk of bleeding, infection, nerve damage and headache -  risk of failed spinal - damage to teeth, lips or other oral mucosa - sore throat or hoarseness - Damage to heart, brain, lungs or loss of life  Patient voiced understanding.)        Anesthesia Quick Evaluation

## 2017-03-11 NOTE — Op Note (Signed)
OPERATIVE NOTE  DATE OF SURGERY:  03/11/2017  PATIENT NAME:  Monica Rice   DOB: 28-Jul-1950  MRN: [id number]  PRE-OPERATIVE DIAGNOSIS: Degenerative arthrosis of the left hip, primary  POST-OPERATIVE DIAGNOSIS:  Same  PROCEDURE:  Left total hip arthroplasty  SURGEON:  Marciano Sequin. M.D.  ASSISTANT:  Vance Peper, PA (present and scrubbed throughout the case, critical for assistance with exposure, retraction, instrumentation, and closure)  ANESTHESIA: spinal  ESTIMATED BLOOD LOSS: 200 mL  FLUIDS REPLACED: 1300 mL of crystalloid  DRAINS: 2 medium drains to a Hemovac reservoir  IMPLANTS UTILIZED: DePuy 12 mm small stature AML femoral stem, 50 mm OD Pinnacle 100 acetabular component, +4 mm 10 Pinnacle Marathon polyethylene insert, and a 32 mm CoCr +1 mm hip ball  INDICATIONS FOR SURGERY: Monica Rice is a 66 y.o. year old female with a long history of progressive hip and groin  pain. X-rays demonstrated severe degenerative changes. The patient had not seen any significant improvement despite conservative nonsurgical intervention. After discussion of the risks and benefits of surgical intervention, the patient expressed understanding of the risks benefits and agree with plans for total hip arthroplasty.   The risks, benefits, and alternatives were discussed at length including but not limited to the risks of infection, bleeding, nerve injury, stiffness, blood clots, the need for revision surgery, limb length inequality, dislocation, cardiopulmonary complications, among others, and they were willing to proceed.  PROCEDURE IN DETAIL: The patient was brought into the operating room and, after adequate spinal anesthesia was achieved, the patient was placed in a right lateral decubitus position. Axillary roll was placed and all bony prominences were well-padded. The patient's left hip was cleaned and prepped with alcohol and DuraPrep and draped in the usual sterile fashion. A  "timeout" was performed as per usual protocol. A lateral curvilinear incision was made gently curving towards the posterior superior iliac spine. The IT band was incised in line with the skin incision and the fibers of the gluteus maximus were split in line. The piriformis tendon was identified, skeletonized, and incised at its insertion to the proximal femur and reflected posteriorly. A T type posterior capsulotomy was performed. Prior to dislocation of the femoral head, a threaded Steinmann pin was inserted through a separate stab incision into the pelvis superior to the acetabulum and bent in the form of a stylus so as to assess limb length and hip offset throughout the procedure. The femoral head was then dislocated posteriorly. Inspection of the femoral head demonstrated severe degenerative changes with full-thickness loss of articular cartilage. The femoral neck cut was performed using an oscillating saw. The anterior capsule was elevated off of the femoral neck using a periosteal elevator. Attention was then directed to the acetabulum. The remnant of the labrum was excised using electrocautery. Inspection of the acetabulum also demonstrated significant degenerative changes. The acetabulum was reamed in sequential fashion up to a 49 mm diameter. Good punctate bleeding bone was encountered. A 50 mm Pinnacle 100 acetabular component was positioned and impacted into place. Good scratch fit was appreciated. A +4 mm neutral polyethylene trial was inserted.  Attention was then directed to the proximal femur. A hole for reaming of the proximal femoral canal was created using a high-speed burr. The femoral canal was reamed in sequential fashion up to a 11.5 mm diameter. This allowed for approximately 7 cm of scratch fit. It was thus elected to ream with a 12 mm reamer to allow for a line to line fit.  Serial broaches were inserted up to a 12 mm small stature femoral broach. Calcar region was planed and a trial  reduction was performed using a 32 mm hip ball with a +1 mm neck length. Reasonably good stability was noted, but it was elected to trial with a +4 mm 10 polyethylene trial. Good equalization of limb lengths and hip offset was appreciated and excellent stability was noted both anteriorly and posteriorly. Trial components were removed. The acetabular shell was irrigated with copious amounts of normal saline with antibiotic solution and suctioned dry. A +4 mm 10 Pinnacle Marathon polyethylene insert was positioned and impacted into place with the high side at 4:00 position. Next, a 12 mm small stature AML femoral stem was positioned and impacted into place. Excellent scratch fit was appreciated. A trial reduction was again performed with a 32 mm hip ball with a +1 mm neck length. Again, good equalization of limb lengths was appreciated and excellent stability appreciated both anteriorly and posteriorly. The hip was then dislocated and the trial hip ball was removed. The Morse taper was cleaned and dried. A 32 mm cobalt chrome hip ball with a +1 mm neck length was placed on the trunnion and impacted into place. The hip was then reduced and placed through range of motion. Excellent stability was appreciated both anteriorly and posteriorly.  The wound was irrigated with copious amounts of normal saline with antibiotic solution and suctioned dry. Good hemostasis was appreciated. The posterior capsulotomy was repaired using #5 Ethibond. Piriformis tendon was reapproximated to the undersurface of the gluteus medius tendon using #5 Ethibond. Two medium drains were placed in the wound bed and brought out through separate stab incisions to be attached to a Hemovac reservoir. The IT band was reapproximated using interrupted sutures of #1 Vicryl. Subcutaneous tissue was approximated using first #0 Vicryl followed by #2-0 Vicryl. The skin was closed with skin staples.  The patient tolerated the procedure well and was  transported to the recovery room in stable condition.   Marciano Sequin., M.D.

## 2017-03-11 NOTE — Progress Notes (Signed)
PHARMACIST - PHYSICIAN ORDER COMMUNICATION  CONCERNING: P&T Medication Policy on Herbal Medications  DESCRIPTION:  This patient's order for:  Hair/skin/nails vitamin  has been noted.  This product(s) is classified as an "herbal" or natural product. Due to a lack of definitive safety studies or FDA approval, nonstandard manufacturing practices, plus the potential risk of unknown drug-drug interactions while on inpatient medications, the Pharmacy and Therapeutics Committee does not permit the use of "herbal" or natural products of this type within [facility].   ACTION TAKEN: The pharmacy department is unable to verify this order at this time and your patient has been informed of this safety policy. Please reevaluate patient's clinical condition at discharge and address if the herbal or natural product(s) should be resumed at that time.  Lendon Ka, PharmD Pharmacy Resident

## 2017-03-11 NOTE — Transfer of Care (Signed)
Immediate Anesthesia Transfer of Care Note  Patient: Monica Rice  Procedure(s) Performed: TOTAL HIP ARTHROPLASTY (Left )  Patient Location: PACU  Anesthesia Type:Spinal  Level of Consciousness: awake and patient cooperative  Airway & Oxygen Therapy: Patient Spontanous Breathing and Patient connected to face mask oxygen  Post-op Assessment: Report given to RN and Post -op Vital signs reviewed and stable  Post vital signs: Reviewed and stable  Last Vitals:  Vitals:   03/11/17 1050 03/11/17 1555  BP: 131/77 91/61  Pulse: 77 64  Resp: 20 17  Temp: 36.7 C (!) 36.3 C  SpO2: 95% 97%    Last Pain:  Vitals:   03/11/17 1050  TempSrc: Oral  PainSc: 5       Patients Stated Pain Goal: 0 (84/13/24 4010)  Complications: No apparent anesthesia complications

## 2017-03-11 NOTE — H&P (Signed)
The patient has been re-examined, and the chart reviewed, and there have been no interval changes to the documented history and physical.    The risks, benefits, and alternatives have been discussed at length. The patient expressed understanding of the risks benefits and agreed with plans for surgical intervention.  James P. Hooten, Jr. M.D.    

## 2017-03-11 NOTE — Anesthesia Post-op Follow-up Note (Signed)
Anesthesia QCDR form completed.        

## 2017-03-11 NOTE — Anesthesia Procedure Notes (Signed)
Spinal  Patient location during procedure: OR Start time: 03/11/2017 12:22 PM End time: 03/11/2017 12:27 PM Staffing Performed: resident/CRNA  Preanesthetic Checklist Completed: patient identified, site marked, surgical consent, pre-op evaluation, timeout performed, IV checked, risks and benefits discussed and monitors and equipment checked Spinal Block Patient position: sitting Prep: ChloraPrep Patient monitoring: heart rate, continuous pulse ox, blood pressure and cardiac monitor Approach: left paramedian Location: L4-5 Injection technique: single-shot Needle Needle type: Introducer and Pencan  Needle gauge: 24 G Needle length: 9 cm Additional Notes Negative paresthesia. Negative blood return. Positive free-flowing CSF. Expiration date of kit checked and confirmed. Patient tolerated procedure well, without complications.

## 2017-03-12 LAB — GLUCOSE, CAPILLARY
Glucose-Capillary: 209 mg/dL — ABNORMAL HIGH (ref 65–99)
Glucose-Capillary: 179 mg/dL — ABNORMAL HIGH (ref 65–99)
Glucose-Capillary: 192 mg/dL — ABNORMAL HIGH (ref 65–99)
Glucose-Capillary: 189 mg/dL — ABNORMAL HIGH (ref 65–99)

## 2017-03-12 LAB — CBC
HEMATOCRIT: 33.5 % — AB (ref 35.0–47.0)
HEMOGLOBIN: 11.7 g/dL — AB (ref 12.0–16.0)
MCH: 30.6 pg (ref 26.0–34.0)
MCHC: 34.9 g/dL (ref 32.0–36.0)
MCV: 87.5 fL (ref 80.0–100.0)
Platelets: 168 10*3/uL (ref 150–440)
RBC: 3.83 MIL/uL (ref 3.80–5.20)
RDW: 13.5 % (ref 11.5–14.5)
WBC: 9.8 10*3/uL (ref 3.6–11.0)

## 2017-03-12 LAB — BASIC METABOLIC PANEL
ANION GAP: 5 (ref 5–15)
BUN: 12 mg/dL (ref 6–20)
CALCIUM: 8.1 mg/dL — AB (ref 8.9–10.3)
CHLORIDE: 103 mmol/L (ref 101–111)
CO2: 27 mmol/L (ref 22–32)
CREATININE: 0.48 mg/dL (ref 0.44–1.00)
GFR calc non Af Amer: >60 mL/min (ref >60)
Glucose, Bld: 232 mg/dL — ABNORMAL HIGH (ref 65–99)
Potassium: 3.9 mmol/L (ref 3.5–5.1)
SODIUM: 135 mmol/L (ref 135–145)

## 2017-03-12 MED ORDER — OXYCODONE HCL 5 MG PO TABS: 5.0000 mg | ORAL_TABLET | ORAL | 0 refills | Status: DC | PRN

## 2017-03-12 MED ORDER — TRAMADOL HCL 50 MG PO TABS: 50.0000 mg | ORAL_TABLET | ORAL | 0 refills | Status: DC | PRN

## 2017-03-12 MED ORDER — ENOXAPARIN SODIUM 40 MG/0.4ML ~~LOC~~ SOLN: 40.0000 mg | SUBCUTANEOUS | 0 refills | Status: DC

## 2017-03-12 MED ORDER — INSULIN ASPART PROT & ASPART (70-30 MIX) 100 UNIT/ML ~~LOC~~ SUSP
20.0000 [IU] | Freq: Every day | SUBCUTANEOUS | Status: DC
  Administered 2017-03-12: 20 [IU] via SUBCUTANEOUS
  Filled 2017-03-12: qty 10

## 2017-03-12 NOTE — Progress Notes (Signed)
Up to bedside commode this morning. Able to void after foley removed.

## 2017-03-12 NOTE — Progress Notes (Addendum)
Physical Therapy Treatment Patient Details Name: Monica Rice MRN: [id number] DOB: 1951/03/10 Today's Date: 03/12/2017    History of Present Illness Pt underwent L THR posterior approach without reported post-op complications. She is POD#1 at time of PT evaluation.    PT Comments    Pt demonstrated progress with mobility during afternoon treatment as compared to morning evaluation.  She was able to increase distance ambulated with use of RW and demonstrated understanding of proper step pattern technique with min VC's.  Pt requires RW for balance and pain management during transfers and gait.  Pt presentation during ambulation indicated mild unsteadiness on feet due to antalgic gait.  Pt was able to complete all therapeutic exercises with min VC's and without report of pain increase.  She demonstrates awareness of hip precautions with min VC's but was not able to repeat precautions back to PT verbatim.  Pt will need to review HEP tomorrow and address any questions that pt may have.  Pt expressed interest in HEP and success with home exercises following previous surgery demonstrating motivation for improvement during the healing process.   Follow Up Recommendations  Home health PT     Equipment Recommendations  None recommended by PT    Recommendations for Other Services       Precautions / Restrictions Precautions Precautions: Fall;Posterior Hip Precaution Booklet Issued: Yes (comment) Restrictions Weight Bearing Restrictions: Yes LLE Weight Bearing: Weight bearing as tolerated    Mobility  Bed Mobility Overal bed mobility: Needs Assistance Bed Mobility: Supine to Sit     Supine to sit: Mod assist     General bed mobility comments: Pt performed supine to sit with unilateral hand held min assist from PT and use of bed rail with L UE. She is able to slide herself forward to EOB while properly monitoring hip precautions.   Transfers Overall transfer level: Needs  assistance Equipment used: Rolling walker (2 wheeled) Transfers: Sit to/from Stand Sit to Stand: Supervision         General transfer comment: Pt is aware of posterior THA precautions and proper hand placement during STS transfer.  PT provided min VC's for awareness of hip precautions.  Ambulation/Gait Ambulation/Gait assistance: Supervision Ambulation Distance (Feet): 100 Feet Assistive device: Rolling walker (2 wheeled) Gait Pattern/deviations: Decreased step length - right;Decreased step length - left;Decreased stance time - left;Decreased weight shift to left Gait velocity: Slow, antalgic gait (Slow, antalgic gait)   General Gait Details: Pt is able to ambulate with a partial step through gait pattern with use of RW.  Pt was able to increase distance of ambulation and expressed fatigue due to standing for 3-4 min. while the nurse changed pt's IV fluids.  PT provided VC's for sequencing and appropriate foot placement when turning toward affected side.  Pt was able to follow cues and demonstrate understanding.   Stairs            Wheelchair Mobility    Modified Rankin (Stroke Patients Only)       Balance                                            Cognition Arousal/Alertness: Awake/alert Behavior During Therapy: WFL for tasks assessed/performed Overall Cognitive Status: Within Functional Limits for tasks assessed  Exercises Total Joint Exercises Ankle Circles/Pumps: Strengthening;10 reps;Seated Short Arc Quad: Strengthening;Left;10 reps Heel Slides: Strengthening;Left;10 reps;Seated Hip ABduction/ADduction: Strengthening;Both;10 reps;Seated Marching in Standing: Strengthening;Both;10 reps;Seated    General Comments        Pertinent Vitals/Pain Pain Assessment: 0-10 Pain Score: 3  Pain Location: L hip Pain Descriptors / Indicators: Aching;Sore Pain Intervention(s): Premedicated before  session    Home Living                      Prior Function            PT Goals (current goals can now be found in the care plan section) Acute Rehab PT Goals Patient Stated Goal: Return to prior function at home PT Goal Formulation: With patient Time For Goal Achievement: 03/26/17 Potential to Achieve Goals: Good Progress towards PT goals: Progressing toward goals    Frequency    BID      PT Plan Current plan remains appropriate    Co-evaluation              AM-PAC PT "6 Clicks" Daily Activity  Outcome Measure  Difficulty turning over in bed (including adjusting bedclothes, sheets and blankets)?: A Little Difficulty moving from lying on back to sitting on the side of the bed? : A Lot Difficulty sitting down on and standing up from a chair with arms (e.g., wheelchair, bedside commode, etc,.)?: A Little Help needed moving to and from a bed to chair (including a wheelchair)?: A Little Help needed walking in hospital room?: A Little Help needed climbing 3-5 steps with a railing? : A Lot 6 Click Score: 16    End of Session Equipment Utilized During Treatment: Gait belt Activity Tolerance: Patient limited by fatigue Patient left: in chair Nurse Communication: Mobility status PT Visit Diagnosis: Unsteadiness on feet (R26.81);Muscle weakness (generalized) (M62.81);Difficulty in walking, not elsewhere classified (R26.2);Pain Pain - Right/Left: Left Pain - part of body: Hip     Time: 1325-1400 PT Time Calculation (min) (ACUTE ONLY): 35 min  Charges:  $Gait Training: 8-22 mins $Therapeutic Exercise: 8-22 mins                    G Codes:       Roxanne Gates, PT, DPT   Roxanne Gates 03/12/2017, 2:53 PM

## 2017-03-12 NOTE — Progress Notes (Signed)
Pt remaining alert and oriented this shift. Pain control with oral pain medication. Surgical dressing dry and intact. Iv infusing without difficulty. Foley patent and draining urine. Pt able to sleep in between care.

## 2017-03-12 NOTE — Discharge Instructions (Signed)
Instructions after Total Hip Replacement     Monica Rice., M.D.     Dept[facility] of [facility]  [address], [place]  [address]  Phone: [phone]   Fax: [phone]    DIET:  Drink plenty of non-alcoholic fluids.  Resume your normal diet. Include foods high in fiber.  ACTIVITY:   You may use crutches or a walker with weight-bearing as tolerated, unless instructed otherwise.  You may be weaned off of the walker or crutches by your Physical Therapist.   Do NOT reach below the level of your knees or cross your legs until allowed.     Continue doing gentle exercises. Exercising will reduce the pain and swelling, increase motion, and prevent muscle weakness.    Please continue to use the TED compression stockings for 6 weeks. You may remove the stockings at night, but should reapply them in the morning.  Do not drive or operate any equipment until instructed.  WOUND CARE:   Continue to use ice packs periodically to reduce pain and swelling.  Keep the incision clean and dry.  You may bathe or shower after the staples are removed at the first office visit following surgery.  MEDICATIONS:  You may resume your regular medications.  Please take the pain medication as prescribed on the medication.  Do not take pain medication on an empty stomach.  You have been given a prescription for a blood thinner to prevent blood clots. Please take the medication as instructed. (NOTE: After completing a 2 week course of Lovenox, take one Enteric-coated aspirin once a day.)  Pain medications and iron supplements can cause constipation. Use a stool softener (Senokot or Colace) on a daily basis and a laxative (dulcolax or miralax) as needed.  Do not drive or drink alcoholic beverages when taking pain medications.  CALL THE OFFICE FOR:  Temperature above 101 degrees  Excessive bleeding or drainage on the dressing.  Excessive  swelling, coldness, or paleness of the toes.  Persistent nausea and vomiting.  FOLLOW-UP:   You should have an appointment to return to the office in 6 weeks after surgery.  Arrangements have been made for continuation of Physical Therapy (either home therapy or outpatient therapy).    POSTERIOR TOTAL HIP REPLACEMENT POSTOPERATIVE DIRECTIONS  Hip Rehabilitation, Guidelines Following Surgery  The results of a hip operation are greatly improved after range of motion and muscle strengthening exercises. Follow all safety measures which are given to protect your hip. If any of these exercises cause increased pain or swelling in your joint, decrease the amount until you are comfortable again. Then slowly increase the exercises. Call your caregiver if you have problems or questions.   HOME CARE INSTRUCTIONS  Remove items at home which could result in a fall. This includes throw rugs or furniture in walking pathways.   ICE to the affected hip every three hours for 30 minutes at a time and then as needed for pain and swelling.  Continue to use ice on the hip for pain and swelling from surgery. You may notice swelling that will progress down to the foot and ankle.  This is normal after surgery.  Elevate the leg when you are not up walking on it.    Continue to use the breathing machine which will help keep your temperature down.  It is common for your temperature to cycle up and down following surgery, especially at night when you are not up  moving around and exerting yourself.  The breathing machine keeps your lungs expanded and your temperature down.  DIET You may resume your previous home diet once your are discharged from the hospital.  DRESSING / WOUND  CARE / SHOWERING  DO NOT GET THE INCISION WET You may start showering once staples have been removed at 2 weeks. Change dressing as needed. Do not submerge the incision in water such as a bath tub, swimming pool or hot tubs until the incision  is completely healed which is approximately 4 weeks.  STAPLE REMOVAL: Please remove staple at 2 weeks post op and apply benzoin and 1/2 inch steri strips  ACTIVITY Walk with your walker as instructed. Use walker as long as suggested by your caregivers.May go to using a cane once your therapist feels that it is safe to do so. Avoid periods of inactivity such as sitting longer than an hour when not asleep. This helps prevent blood clots.  You may resume a sexual relationship in one month or when given the OK by your doctor.  You may return to work once you are cleared by your doctor.  Do not drive a car for 6 weeks or until released by you surgeon.  Do not drive while taking narcotics.   WEIGHT BEARING You may wait bear as tolerated on the surgical leg.  POSTOPERATIVE CONSTIPATION PROTOCOL Constipation - defined medically as fewer than three stools per week and severe constipation as less than one stool per week.  One of the most common issues patients have following surgery is constipation.  Even if you have a regular bowel pattern at home, your normal regimen is likely to be disrupted due to multiple reasons following surgery.  Combination of anesthesia, postoperative narcotics, change in appetite and fluid intake all can affect your bowels.  In order to avoid complications following surgery, here are some recommendations in order to help you during your recovery period.  Colace (docusate) - Pick up an over-the-counter form of Colace or another stool softener and take twice a day as long as you are requiring postoperative pain medications.  Take with a full glass of water daily.  If you experience loose stools or diarrhea, hold the colace until you stool forms back up.  If your symptoms do not get better within 1 week or if they get worse, check with your doctor.  Dulcolax (bisacodyl) - Pick up over-the-counter and take as directed by the product packaging as needed to assist with the  movement of your bowels.  Take with a full glass of water.  Use this product as needed if not relieved by Colace only.   MiraLax (polyethylene glycol) - Pick up over-the-counter to have on hand.  MiraLax is a solution that will increase the amount of water in your bowels to assist with bowel movements.  Take as directed and can mix with a glass of water, juice, soda, coffee, or tea.  Take if you go more than two days without a movement. Do not use MiraLax more than once per day. Call your doctor if you are still constipated or irregular after using this medication for 7 days in a row.  If you continue to have problems with postoperative constipation, please contact the office for further assistance and recommendations.  If you experience "the worst abdominal pain ever" or develop nausea or vomiting, please contact the office immediatly for further recommendations for treatment.  ITCHING  If you experience itching with your medications, try taking only  a single pain pill, or even half a pain pill at a time.  You can also use Benadryl over the counter for itching or also to help with sleep.   TED HOSE STOCKINGS Wear the elastic stockings on both legs. If you go home, you may remove these at night but will need to put them on the first thing in the morning. If you go to rehab, then you may remove them one hour per 8 hour shift. This is because in rehab you are not as active as you are at home.  MEDICATIONS See your medication summary on the After Visit Summary that the nursing staff will review with you prior to discharge.  You may have some home medications which will be placed on hold until you complete the course of blood thinner medication.  It is important for you to complete the blood thinner medication as prescribed by your surgeon.  Continue your approved medications as instructed at time of discharge.  PRECAUTIONS If you experience chest pain or shortness of breath - call 911 immediately for  transfer to the hospital emergency department.  If you develop a fever greater that 101 F, purulent drainage from wound, increased redness or drainage from wound, foul odor from the wound/dressing, or calf pain - CONTACT YOUR SURGEON.                                                   FOLLOW-UP APPOINTMENTS Make sure you keep all of your appointments after your operation with your surgeon and caregivers. You should call the office at the above phone number and make an appointment for approximately 6 weeks after the date of your surgery or on the date instructed by your surgeon outlined in the "After Visit Summary". This appointment should have already been made prior to the surgery. If you don't remember the date and time, call the office at [phone].  RANGE OF MOTION AND STRENGTHENING EXERCISES  These exercises are designed to help you keep full movement of your hip joint. Follow your caregiver's or physical therapist's instructions. Perform all exercises about fifteen times, three times per day or as directed. Exercise both hips, even if you have had only one joint replacement. These exercises can be done on a training (exercise) mat, on the floor, on a table or on a bed. Use whatever works the best and is most comfortable for you. Use music or television while you are exercising so that the exercises are a pleasant break in your day. This will make your life better with the exercises acting as a break in routine you can look forward to.  Lying on your back, slowly slide your foot toward your buttocks, raising your knee up off the floor. Then slowly slide your foot back down until your leg is straight again.  Lying on your back spread your legs as far apart as you can without causing discomfort.  Lying on your side, raise your upper leg and foot straight up from the floor as far as is comfortable. Slowly lower the leg and repeat.  Lying on your back, tighten up the muscle in the front of your thigh  (quadriceps muscles). You can do this by keeping your leg straight and trying to raise your heel off the floor. This helps strengthen the largest muscle supporting your knee.  Lying  on your back, tighten up the muscles of your buttocks both with the legs straight and with the knee bent at a comfortable angle while keeping your heel on the floor.   DON'T FORGET THE 4 POSTERIOR HIP PRECAUTIONS   IF YOU ARE TRANSFERRED TO A SKILLED REHAB FACILITY If the patient is transferred to a skilled rehab facility following release from the hospital, a list of the current medications will be sent to the facility for the patient to continue.  When discharged from the skilled rehab facility, please have the facility set up the patient's [place] prior to being released. Also, the skilled facility will be responsible for providing the patient with their medications at time of release from the facility to include their pain medication, the muscle relaxants, and their blood thinner medication. If the patient is still at the rehab facility at time of the two week follow up appointment, the skilled rehab facility will also need to assist the patient in arranging follow up appointment in our office and any transportation needs.  MAKE SURE YOU:  Understand these instructions.  Get help right away if you are not doing well or get worse.    Pick up stool softner and laxative for home use following surgery while on pain medications. Continue to use ice for pain and swelling after surgery. Do not use any lotions or creams on the incision until instructed by your surgeon.

## 2017-03-12 NOTE — Evaluation (Signed)
Occupational Therapy Evaluation Patient Details Name: Monica Rice MRN: [id number] DOB: 01/26/1951 Today's Date: 03/12/2017    History of Present Illness Pt underwent L THR posterior approach without reported post-op complications. She is POD#1 at time of OT evaluation.   Clinical Impression   Pt is 66 year old female s/p L THR(posterior total hip precautions) who lives at home with her husband. Pt was independent in all ADLs prior to surgery and is eager to return to PLOF.  Pt is currently limited in functional ADLs due to pain and decreased ROM. Pt able to recall 1/3 posterior THPs and min guard to min assist for LB ADL tasks. Pt would benefit from continued skilled OT services while in the hospital for education in AE for LB dressing, review of posterior hip precautions to maximize recall and carry over, functional mobility training, and education in recommendations for home modifications to increase safety and prevent falls. No anticipated OT needs following hospitalization.      Follow Up Recommendations  No OT follow up    Equipment Recommendations  None recommended by OT    Recommendations for Other Services       Precautions / Restrictions Precautions Precautions: Fall;Posterior Hip Precaution Booklet Issued: No Precaution Comments: Pt able to recall 1/3 posterior THPs, educated in precautions, able to verbalize at end of session 3/3. Would benefit from additional education to ensure recall and carryover. Restrictions Weight Bearing Restrictions: Yes LLE Weight Bearing: Weight bearing as tolerated      Mobility Bed Mobility Overal bed mobility: Needs Assistance Bed Mobility: Supine to Sit     Supine to sit: Mod assist     General bed mobility comments: deferred, pt up in recliner for session  Transfers Overall transfer level: Needs assistance Equipment used: Rolling walker (2 wheeled) Transfers: Sit to/from Stand Sit to Stand: Supervision          General transfer comment: verbal cues for posterior THA precautions    Balance Overall balance assessment: Needs assistance Sitting-balance support: No upper extremity supported Sitting balance-Leahy Scale: Good     Standing balance support: Bilateral upper extremity supported Standing balance-Leahy Scale: Fair                             ADL either performed or assessed with clinical judgement   ADL Overall ADL's : Needs assistance/impaired     Grooming: Standing;Set up;Min guard   Upper Body Bathing: Sitting;Set up   Lower Body Bathing: Sitting/lateral leans;Sit to/from stand;Min guard;Minimal assistance   Upper Body Dressing : Sitting;Set up   Lower Body Dressing: Sitting/lateral leans;Sit to/from stand;Min guard;Minimal assistance Lower Body Dressing Details (indicate cue type and reason): educated in use of AE for LB dressing tasks. Pt verbalized understanding, may benefit from additional training Toilet Transfer: Min guard;Comfort height toilet;Ambulation;RW           Functional mobility during ADLs: Min guard;Rolling walker       Vision Baseline Vision/History: No visual deficits Patient Visual Report: No change from baseline Vision Assessment?: No apparent visual deficits     Perception     Praxis      Pertinent Vitals/Pain Pain Assessment: 0-10 Pain Score: 3  Pain Location: L hip Pain Descriptors / Indicators: Aching;Sore Pain Intervention(s): Limited activity within patient's tolerance;Monitored during session     Hand Dominance Right   Extremity/Trunk Assessment Upper Extremity Assessment Upper Extremity Assessment: Overall WFL for tasks assessed   Lower Extremity  Assessment Lower Extremity Assessment: Defer to PT evaluation;LLE deficits/detail   Cervical / Trunk Assessment Cervical / Trunk Assessment: Normal   Communication Communication Communication: No difficulties   Cognition Arousal/Alertness: Awake/alert Behavior  During Therapy: WFL for tasks assessed/performed Overall Cognitive Status: Within Functional Limits for tasks assessed                                     General Comments       Exercises    Shoulder Instructions      Home Living Family/patient expects to be discharged to:: Private residence Living Arrangements: Spouse/significant other Available Help at Discharge: Family Type of Home: House Home Access: Stairs to enter Technical brewer of Steps: 3 Entrance Stairs-Rails: None (she can hold onto the post with both hands) Home Layout: Multi-level;Able to live on main level with bedroom/bathroom     Bathroom Shower/Tub: Teacher, early years/pre: Standard     Home Equipment: Toilet riser;Shower seat;Walker - 2 wheels;Cane - single point;Adaptive equipment Adaptive Equipment: Reacher;Sock aid        Prior Functioning/Environment Level of Independence: Independent        Comments: Independent with ADLs/IADLs. Full community ambulator without assistive device. No falls. Drives.         OT Problem List: Decreased strength;Decreased range of motion;Decreased knowledge of use of DME or AE      OT Treatment/Interventions: Self-care/ADL training;Therapeutic activities;Therapeutic exercise;DME and/or AE instruction;Patient/family education    OT Goals(Current goals can be found in the care plan section) Acute Rehab OT Goals Patient Stated Goal: Return to prior function at home OT Goal Formulation: With patient Time For Goal Achievement: 03/19/17 Potential to Achieve Goals: Good ADL Goals Pt Will Perform Lower Body Dressing: with adaptive equipment;with set-up;with supervision;sitting/lateral leans;sit to/from stand Pt Will Transfer to Toilet: with min guard assist;ambulating (comfort height toilet, RW for ambulation) Additional ADL Goal #1: Pt will verbalize 3/3 posterior total hip precautions.   OT Frequency: Min 1X/week   Barriers to D/C:             Co-evaluation              AM-PAC PT "6 Clicks" Daily Activity     Outcome Measure Help from another person eating meals?: None Help from another person taking care of personal grooming?: None Help from another person toileting, which includes using toliet, bedpan, or urinal?: A Little Help from another person bathing (including washing, rinsing, drying)?: A Little Help from another person to put on and taking off regular upper body clothing?: None Help from another person to put on and taking off regular lower body clothing?: A Little 6 Click Score: 21   End of Session Equipment Utilized During Treatment: Gait belt;Rolling walker  Activity Tolerance: Patient tolerated treatment well Patient left: in chair;with call bell/phone within reach;with chair alarm set;with family/visitor present;with SCD's reapplied  OT Visit Diagnosis: Other abnormalities of gait and mobility (R26.89)                Time: 0865-7846 OT Time Calculation (min): 11 min Charges:  OT General Charges $OT Visit: 1 Visit OT Evaluation $OT Eval Low Complexity: 1 Low G-Codes: OT G-codes **NOT FOR INPATIENT CLASS** Functional Assessment Tool Used: AM-PAC 6 Clicks Daily Activity;Clinical judgement Functional Limitation: Self care Self Care Current Status (N6295): At least 1 percent but less than 20 percent impaired, limited or restricted Self Care  Goal Status (B1694): At least 1 percent but less than 20 percent impaired, limited or restricted   Jeni Salles, MPH, MS, OTR/L ascom [phone] 03/12/17, 3:39 PM

## 2017-03-12 NOTE — NC FL2 (Signed)
[place] LEVEL OF CARE SCREENING TOOL     IDENTIFICATION  Patient Name: Monica Rice Birthdate: 1950/06/09 Sex: female Admission Date (Current Location): 03/11/2017  [place] and [place] Number:  Engineering geologist and Address:  [facility], [address], [place], [place] [address]      Provider Number: [id number]  Attending Physician Name and Address:  Dereck Leep, MD  Relative Name and Phone Number:       Current Level of Care: SNF Recommended Level of Care: [place] Prior Approval Number:    Date Approved/Denied:   PASRR Number:   [id number]  Discharge Plan: SNF    Current Diagnoses: Patient Active Problem List   Diagnosis Date Noted  . 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 Depression 01/30/2016  . Diabetes mellitus type 2, uncomplicated ([place]) 89/21/1941  . Mixed hyperlipidemia 01/30/2016  . Sleep apnea 01/30/2016  . Status post total replacement of right hip 06/26/2015  . Chronic midline low back pain with bilateral sciatica 11/02/2014  . Bilateral carotid artery stenosis 10/04/2014  . Benign essential hypertension 09/21/2014  . DDD (degenerative disc disease), lumbar 12/28/2013  . Lumbar radiculitis 12/09/2013    Orientation RESPIRATION BLADDER Height & Weight     Self, Time, Situation, Place  Normal Continent Weight: 168 lb (76.2 kg) Height:  5\' 5"  (165.1 cm)  BEHAVIORAL SYMPTOMS/MOOD NEUROLOGICAL BOWEL NUTRITION STATUS      Continent Diet (Carb Modified)  AMBULATORY STATUS COMMUNICATION OF NEEDS Skin   Extensive Assist Verbally Surgical wounds (Left Hip Incision)                       Personal Care Assistance Level of Assistance  Bathing, Feeding, Dressing Bathing Assistance: Limited assistance Feeding assistance: Independent Dressing Assistance: Limited assistance     Functional Limitations Info  Sight, Hearing, Speech Sight Info: Adequate Hearing Info:  Adequate Speech Info: Adequate    SPECIAL CARE FACTORS FREQUENCY  PT (By licensed PT), OT (By licensed OT)     PT Frequency:  (5) OT Frequency:  (5)            Contractures      Additional Factors Info  Code Status, Allergies, Isolation Precautions Code Status Info:  (Full Code) Allergies Info:  (HYDROCODONE, Neomycin-bacitracin-polymyxin BACITRACIN-NEOMYCIN-POLYMYXIN, SULFA ANTIBIOTICS )     Isolation Precautions Info:  (Nasal Swab)     Current Medications (03/12/2017):  This is the current hospital active medication list Current Facility-Administered Medications  Medication Dose Route Frequency Provider Last Rate Last Dose  . 0.9 %  sodium chloride infusion   Intravenous Continuous Dereck Leep, MD 100 mL/hr at 03/11/17 2136    . acetaminophen (OFIRMEV) IV 1,000 mg  1,000 mg Intravenous Q6H Hooten, Laurice Record, MD   Stopped at 03/12/17 0517  . acetaminophen (TYLENOL) tablet 650 mg  650 mg Oral Q6H PRN Hooten, Laurice Record, MD       Or  . acetaminophen (TYLENOL) suppository 650 mg  650 mg Rectal Q6H PRN Hooten, Laurice Record, MD      . alum & mag hydroxide-simeth (MAALOX/MYLANTA) 200-200-20 MG/5ML suspension 30 mL  30 mL Oral Q4H PRN Hooten, Laurice Record, MD      . amLODipine (NORVASC) tablet 5 mg  5 mg Oral Daily Hooten, Laurice Record, MD      . atorvastatin (LIPITOR) tablet 40 mg  40 mg Oral QHS Hooten, Laurice Record, MD   40 mg  at 03/11/17 2132  . bisacodyl (DULCOLAX) suppository 10 mg  10 mg Rectal Daily PRN Dereck Leep, MD      . buPROPion (WELLBUTRIN XL) 24 hr tablet 150 mg  150 mg Oral QHS Dereck Leep, MD   150 mg at 03/11/17 2132  . calcium-vitamin D (OSCAL WITH D) 500-200 MG-UNIT per tablet 1 tablet  1 tablet Oral Daily Hooten, Laurice Record, MD      . ceFAZolin (ANCEF) 2 g in dextrose 5 % 100 mL injection   Intravenous Q6H Hooten, Laurice Record, MD      . clonazePAM (KLONOPIN) tablet 0.5 mg  0.5 mg Oral Daily PRN Hooten, Laurice Record, MD      . diphenhydrAMINE (BENADRYL) 12.5 MG/5ML elixir 12.5-25 mg   12.5-25 mg Oral Q4H PRN Hooten, Laurice Record, MD      . enoxaparin (LOVENOX) injection 40 mg  40 mg Subcutaneous Q24H Hooten, Laurice Record, MD      . ferrous sulfate tablet 325 mg  325 mg Oral BID WC Hooten, Laurice Record, MD      . FLUoxetine (PROZAC) capsule 40 mg  40 mg Oral Daily Hooten, Laurice Record, MD      . fluticasone (FLONASE) 50 MCG/ACT nasal spray 2 spray  2 spray Each Nare Daily PRN Hooten, Laurice Record, MD      . gabapentin (NEURONTIN) capsule 100 mg  100 mg Oral QHS Hooten, Laurice Record, MD   100 mg at 03/11/17 2132  . hydrochlorothiazide (HYDRODIURIL) tablet 25 mg  25 mg Oral Daily Hooten, Laurice Record, MD      . insulin aspart (novoLOG) injection 0-15 Units  0-15 Units Subcutaneous TID WC Hooten, Laurice Record, MD      . insulin aspart protamine- aspart (NOVOLOG MIX 70/30) injection 20 Units  20 Units Subcutaneous Q breakfast Hooten, Laurice Record, MD      . insulin aspart protamine- aspart (NOVOLOG MIX 70/30) injection 60 Units  60 Units Subcutaneous Q supper Hooten, Laurice Record, MD      . latanoprost (XALATAN) 0.005 % ophthalmic solution 1 drop  1 drop Both Eyes QHS Hooten, Laurice Record, MD   1 drop at 03/11/17 2131  . magnesium hydroxide (MILK OF MAGNESIA) suspension 30 mL  30 mL Oral Daily PRN Hooten, Laurice Record, MD      . menthol-cetylpyridinium (CEPACOL) lozenge 3 mg  1 lozenge Oral PRN Hooten, Laurice Record, MD       Or  . phenol (CHLORASEPTIC) mouth spray 1 spray  1 spray Mouth/Throat PRN Hooten, Laurice Record, MD      . metFORMIN (GLUCOPHAGE) tablet 1,000 mg  1,000 mg Oral BID WC Hooten, Laurice Record, MD      . metoCLOPramide (REGLAN) tablet 10 mg  10 mg Oral TID AC & HS Hooten, Laurice Record, MD   10 mg at 03/11/17 2132  . morphine 2 MG/ML injection 2 mg  2 mg Intravenous Q2H PRN Hooten, Laurice Record, MD      . multivitamin with minerals tablet 1 tablet  1 tablet Oral Daily Hooten, Laurice Record, MD      . ondansetron (ZOFRAN) tablet 4 mg  4 mg Oral Q6H PRN Hooten, Laurice Record, MD       Or  . ondansetron (ZOFRAN) injection 4 mg  4 mg Intravenous Q6H PRN Hooten,  Laurice Record, MD      . oxyCODONE (Oxy IR/ROXICODONE) immediate release tablet 5-10 mg  5-10 mg Oral Q4H PRN Hooten, Laurice Record, MD  10 mg at 03/12/17 0112  . pantoprazole (PROTONIX) EC tablet 40 mg  40 mg Oral BID Dereck Leep, MD   40 mg at 03/11/17 2132  . senna-docusate (Senokot-S) tablet 1 tablet  1 tablet Oral BID Dereck Leep, MD   1 tablet at 03/11/17 2132  . sodium phosphate (FLEET) 7-19 GM/118ML enema 1 enema  1 enema Rectal Once PRN Hooten, Laurice Record, MD      . traMADol Veatrice Bourbon) tablet 50-100 mg  50-100 mg Oral Q4H PRN Dereck Leep, MD   100 mg at 03/12/17 0502  . vitamin B-12 (CYANOCOBALAMIN) tablet 1,000 mcg  1,000 mcg Oral Daily Hooten, Laurice Record, MD         Discharge Medications: Please see discharge summary for a list of discharge medications.  Relevant Imaging Results:  Relevant Lab Results:   Additional Information  (SSN: [id number])  Smith Mince, Student-Social Work

## 2017-03-12 NOTE — Progress Notes (Signed)
Pt with full feeling this morning. Tolerated dangling out of bed. Able to place feet on the floor, tolerated well.

## 2017-03-12 NOTE — Anesthesia Postprocedure Evaluation (Signed)
Anesthesia Post Note  Patient: Monica Rice  Procedure(s) Performed: TOTAL HIP ARTHROPLASTY (Left )  Patient location during evaluation: Nursing Unit Anesthesia Type: Spinal Level of consciousness: oriented and awake and alert Pain management: pain level controlled Vital Signs Assessment: post-procedure vital signs reviewed and stable Respiratory status: spontaneous breathing and respiratory function stable Cardiovascular status: blood pressure returned to baseline and stable Postop Assessment: no headache, no backache and no apparent nausea or vomiting Anesthetic complications: no     Last Vitals:  Vitals:   03/11/17 2340 03/12/17 0418  BP: 129/71 120/69  Pulse: 80 80  Resp: 18 18  Temp: 37 C 36.7 C  SpO2: 91% 91%    Last Pain:  Vitals:   03/12/17 0602  TempSrc:   PainSc: 2                  Brantley Fling

## 2017-03-12 NOTE — Discharge Summary (Signed)
Physician Discharge Summary  Patient ID: Monica Rice MRN: [id number] DOB/AGE: 09/08/1950 66 y.o.  Admit date: 03/11/2017 Discharge date: 04/13/2017  Admission Diagnoses:  PRIMARY OSTEOARTHRITIS OF LEFT HIP   Discharge Diagnoses: Patient Active Problem List   Diagnosis Date Noted  . 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 Depression 01/30/2016  . Diabetes mellitus type 2, uncomplicated ([place]) 82/99/3716  . Mixed hyperlipidemia 01/30/2016  . Sleep apnea 01/30/2016  . Status post total replacement of right hip 06/26/2015  . Chronic midline low back pain with bilateral sciatica 11/02/2014  . Bilateral carotid artery stenosis 10/04/2014  . Benign essential hypertension 09/21/2014  . DDD (degenerative disc disease), lumbar 12/28/2013  . Lumbar radiculitis 12/09/2013    Past Medical History:  Diagnosis Date  . Anemia   . Anxiety   . Cancer ([place])    skin ca  . Complication of anesthesia    "difficult to wake up"  . DDD (degenerative disc disease), lumbar   . Depression   . Diabetes mellitus without complication ([place])   . Difficult intubation   . Fatty liver   . GERD (gastroesophageal reflux disease)   . Glaucoma   . Hypertension      Transfusion: No transfusions during this admission   Consultants (if any):   Discharged Condition: Improved  Hospital Course: Monica Rice is an 66 y.o. female who was admitted 03/11/2017 with a diagnosis of degenerative arthrosis left hip and went to the operating room on 03/11/2017 and underwent the above named procedures.    Surgeries:Procedure(s): TOTAL HIP ARTHROPLASTY on 03/11/2017  PRE-OPERATIVE DIAGNOSIS: Degenerative arthrosis of the left hip, primary  POST-OPERATIVE DIAGNOSIS:  Same  PROCEDURE:  Left total hip arthroplasty  SURGEON:  Marciano Sequin. M.D.  ASSISTANT:  Vance Peper, PA (present and scrubbed throughout the case, critical for assistance with exposure, retraction,  instrumentation, and closure)  ANESTHESIA: spinal  ESTIMATED BLOOD LOSS: 200 mL  FLUIDS REPLACED: 1300 mL of crystalloid  DRAINS: 2 medium drains to a Hemovac reservoir  IMPLANTS UTILIZED: DePuy 12 mm small stature AML femoral stem, 50 mm OD Pinnacle 100 acetabular component, +4 mm 10 Pinnacle Marathon polyethylene insert, and a 32 mm CoCr +1 mm hip ball  INDICATIONS FOR SURGERY: Monica Rice is a 67 y.o. year old female with a long history of progressive hip and groin  pain. X-rays demonstrated severe degenerative changes. The patient had not seen any significant improvement despite conservative nonsurgical intervention. After discussion of the risks and benefits of surgical intervention, the patient expressed understanding of the risks benefits and agree with plans for total hip arthroplasty.   The risks, benefits, and alternatives were discussed at length including but not limited to the risks of infection, bleeding, nerve injury, stiffness, blood clots, the need for revision surgery, limb length inequality, dislocation, cardiopulmonary complications, among others, and they were willing to proceed. Patient tolerated the surgery well. No complications .Patient was taken to PACU where she was stabilized and then transferred to the orthopedic floor.  Patient started on Lovenox 30 q 12 hrs. Foot pumps applied bilaterally at 80 mm hgb. Heels elevated off bed with rolled towels. No evidence of DVT. Calves non tender. Negative [place]. Physical therapy started on day #1 for gait training and transfer with OT starting on  day #1 for ADL and assisted devices. Patient has done well with therapy. Ambulated greater than 200 feet upon being discharged. Was able to ascend and descend 4 steps safely and independently  Patient's IV And Foley were discontinued on day #1 with Hemovac being discontinued on day #2. Dressing was changed on day 2 prior to patient being discharged   She was given  perioperative antibiotics:  Anti-infectives    Start     Dose/Rate Route Frequency Ordered Stop   03/11/17 1800  ceFAZolin (ANCEF) IVPB 2g/100 mL premix  Status:  Discontinued     2 g 200 mL/hr over 30 Minutes Intravenous Every 6 hours 03/11/17 1702 03/11/17 1728   03/11/17 1800  ceFAZolin (ANCEF) 2 g in dextrose 5 % 100 mL injection      Intravenous Every 6 hours 03/11/17 1728 03/12/17 1759   03/11/17 1015  vancomycin (VANCOCIN) IVPB 1000 mg/200 mL premix     1,000 mg 200 mL/hr over 60 Minutes Intravenous  Once 03/11/17 1004 03/11/17 1210   03/11/17 0600  ceFAZolin (ANCEF) IVPB 2g/100 mL premix  Status:  Discontinued     2 g 200 mL/hr over 30 Minutes Intravenous On call to O.R. 03/10/17 2201 03/11/17 0745    .  She was fitted with AV 1 compression foot pump devices, instructed on heel pumps, early ambulation, and fitted with TED stockings bilaterally for DVT prophylaxis.  She benefited maximally from the hospital stay and there were no complications.    Recent vital signs:  Vitals:   03/11/17 2340 03/12/17 0418  BP: 129/71 120/69  Pulse: 80 80  Resp: 18 18  Temp: 98.6 F (37 C) 98.1 F (36.7 C)  SpO2: 91% 91%    Recent laboratory studies:  Lab Results  Component Value Date   HGB 11.7 (L) 03/12/2017   HGB 13.7 02/20/2017   HGB 12.1 01/26/2016   Lab Results  Component Value Date   WBC 9.8 03/12/2017   PLT 168 03/12/2017   Lab Results  Component Value Date   INR 1.04 02/20/2017   Lab Results  Component Value Date   NA 135 03/12/2017   K 3.9 03/12/2017   CL 103 03/12/2017   CO2 27 03/12/2017   BUN 12 03/12/2017   CREATININE 0.48 03/12/2017   GLUCOSE 232 (H) 03/12/2017    Discharge Medications:   Allergies as of 03/12/2017      Reactions   Hydrocodone Other (See Comments)   Hallucinations   Neomycin-bacitracin-polymyxin [bacitracin-neomycin-polymyxin] Rash, Other (See Comments)   Increases infection.   Sulfa Antibiotics Rash, Other (See Comments)    Chest pains, headache and rash      Medication List    STOP taking these medications   aspirin EC 81 MG tablet     TAKE these medications   ACCU-CHEK AVIVA PLUS test strip Generic drug:  glucose blood   glucose blood test strip Use 3 (three) times daily. Use as instructed.   amLODipine 5 MG tablet Commonly known as:  NORVASC Take 5 mg by mouth daily.   atorvastatin 40 MG tablet Commonly known as:  LIPITOR Take 40 mg by mouth at bedtime.   buPROPion 150 MG 24 hr tablet Commonly known as:  WELLBUTRIN XL Take 150 mg by mouth at bedtime.   CALCIUM 600 + D PO Take 1 capsule by mouth daily. Vitamin d3 800iu   calcium-vitamin D 500-200 MG-UNIT tablet Commonly known as:  OSCAL WITH D Take 1 tablet by mouth daily.   Cinnamon 500 MG capsule Take 1,000 mg by mouth 2 (two) times daily.   clonazePAM 0.5 MG tablet Commonly known as:  KLONOPIN Take 0.5 mg by mouth daily as needed for  anxiety.   enoxaparin 40 MG/0.4ML injection Commonly known as:  LOVENOX Inject 0.4 mLs (40 mg total) into the skin daily.   ferrous sulfate 325 (65 FE) MG tablet TAKE ONE TABLET BY MOUTH EVERY MORNING WITH BREAKFAST   FLUoxetine 20 MG capsule Commonly known as:  PROZAC Take 40 mg by mouth daily.   fluticasone 50 MCG/ACT nasal spray Commonly known as:  FLONASE Place 2 sprays into both nostrils daily as needed for allergies.   gabapentin 100 MG capsule Commonly known as:  NEURONTIN Take 100 mg by mouth at bedtime.   HAIR/SKIN/NAILS PO Take 1 tablet by mouth 2 (two) times daily.   hydrochlorothiazide 25 MG tablet Commonly known as:  HYDRODIURIL Take 25 mg by mouth daily.   insulin NPH-regular Human (70-30) 100 UNIT/ML injection Commonly known as:  NOVOLIN 70/30 Inject 20-60 Units into the skin 2 (two) times daily before a meal. Take 20 units in the morning and 60 units in the evening.   latanoprost 0.005 % ophthalmic solution Commonly known as:  XALATAN Place 1 drop into both eyes  at bedtime.   meloxicam 7.5 MG tablet Commonly known as:  MOBIC Take 7.5 mg by mouth 2 (two) times daily as needed for pain.   metFORMIN 1000 MG tablet Commonly known as:  GLUCOPHAGE Take 1,000 mg by mouth 2 (two) times daily with a meal.   MULTIVITAMIN ADULT PO Take 1 tablet by mouth daily.   HAIR/SKIN/NAILS Tabs Take 1 tablet by mouth 2 (two) times daily.   omeprazole 20 MG capsule Commonly known as:  PRILOSEC Take 20 mg by mouth daily.   oxyCODONE 5 MG immediate release tablet Commonly known as:  Oxy IR/ROXICODONE Take 1-2 tablets (5-10 mg total) by mouth every 4 (four) hours as needed for severe pain.   traMADol 50 MG tablet Commonly known as:  ULTRAM Take 1-2 tablets (50-100 mg total) by mouth every 4 (four) hours as needed for moderate pain.   vitamin B-12 1000 MCG tablet Commonly known as:  CYANOCOBALAMIN Take 1,000 mcg by mouth daily.            Durable Medical Equipment        Start     Ordered   03/11/17 1703  DME Walker rolling  Once    Question:  Patient needs a walker to treat with the following condition  Answer:  S/P total hip arthroplasty   03/11/17 1702   03/11/17 1703  DME Bedside commode  Once    Question:  Patient needs a bedside commode to treat with the following condition  Answer:  S/P total hip arthroplasty   03/11/17 1702      Diagnostic Studies: Dg Hip Port Unilat With Pelvis 1v Left  Result Date: 03/11/2017 CLINICAL DATA:  66 year old female with postoperative changes EXAM: DG HIP (WITH OR WITHOUT PELVIS) 1V PORT LEFT COMPARISON:  None. FINDINGS: Bony pelvic ring intact.  No acute fracture line identified. Surgical staples in the soft tissues overlying the left hip with surgical changes of left hip arthroplasty. Surgical drain in place. No fracture at the hardware. Gas within the surgical bed. Hip components congruent. Surgical changes of prior right hip arthroplasty. IMPRESSION: Early surgical changes of left hip arthroplasty without  complicating features. Surgical staples present as well as a surgical drain. Surgical changes of prior right hip arthroplasty. Electronically Signed   By: Corrie Mckusick D.O.   On: 03/11/2017 18:56    Disposition: 01-Home or Self Care  Discharge Instructions  Diet - low sodium heart healthy    Complete by:  As directed    Increase activity slowly    Complete by:  As directed       Follow-up Information    Hooten, Laurice Record, MD On 04/21/2017.   Specialty:  Orthopedic Surgery Why:  at 10:45am Contact information: [place] [address] [phone]            Signed: W[place] Climes 03/12/2017, 7:43 AM

## 2017-03-12 NOTE — Care Management Note (Signed)
Case Management Note  Patient Details  Name: Berta Denson MRN: [id number] Date of Birth: May 04, 1951  Subjective/Objective:   POD # 1 left THA. Met with patient at bedside to discuss discharge planning. Patient lives at home with her spouse who will be her caregiver. She has a walker and denies the need for any other DME. Offered choice of home health agencies and she prefers [facility]. Referral sent to [facility] for HHPT. Pharmacy: [phone]. Will call anticoagulant.on day of discharge.                  Action/Plan: [facility] for home health PT. No DME.   Expected Discharge Date:  03/13/17               Expected Discharge Plan:  [place]  In-House Referral:     Discharge planning Services  CM Consult  Post Acute Care Choice:  Home Health Choice offered to:  Patient  DME Arranged:    DME Agency:     HH Arranged:  PT [place]:  [facility] (formerly [facility])  Status of Service:  In process, will continue to follow  If discussed at Long Length of Stay Meetings, dates discussed:    Additional Comments:  Jolly Mango, RN 03/12/2017, 2:44 PM

## 2017-03-12 NOTE — Progress Notes (Signed)
   Subjective: 1 Day Post-Op Procedure(s) (LRB): TOTAL HIP ARTHROPLASTY (Left) Patient reports pain as moderate.   Patient is well, and has had no acute complaints or problems We will start therapy today.  Plan is to go Home after hospital stay. no nausea and no vomiting Patient denies any chest pains or shortness of breath. Objective: Vital signs in last 24 hours: Temp:  [97.3 F (36.3 C)-98.6 F (37 C)] 98.1 F (36.7 C) (10/11 0418) Pulse Rate:  [58-80] 80 (10/11 0418) Resp:  [14-20] 18 (10/11 0418) BP: (91-131)/(59-77) 120/69 (10/11 0418) SpO2:  [91 %-100 %] 91 % (10/11 0418) Weight:  [76.2 kg (168 lb)] 76.2 kg (168 lb) (10/10 1050) well approximated incision Heels are non tender and elevated off the bed using rolled towels Intake/Output from previous day: 10/10 0701 - 10/11 0700 In: 2445 [I.V.:2245; IV Piggyback:200] Out: 1595 [Urine:1175; Drains:220; Blood:200] Intake/Output this shift: No intake/output data recorded.   Recent Labs  03/12/17 0421  HGB 11.7*    Recent Labs  03/12/17 0421  WBC 9.8  RBC 3.83  HCT 33.5*  PLT 168    Recent Labs  03/12/17 0421  NA 135  K 3.9  CL 103  CO2 27  BUN 12  CREATININE 0.48  GLUCOSE 232*  CALCIUM 8.1*   No results for input(s): LABPT, INR in the last 72 hours.  EXAM General - Patient is Alert, Appropriate and Oriented Extremity - Neurologically intact Neurovascular intact Sensation intact distally Intact pulses distally Dorsiflexion/Plantar flexion intact No cellulitis present Compartment soft Dressing - dressing C/D/I Motor Function - intact, moving foot and toes well on exam.    Past Medical History:  Diagnosis Date  . Anemia   . Anxiety   . Cancer ([place])    skin ca  . Complication of anesthesia    "difficult to wake up"  . DDD (degenerative disc disease), lumbar   . Depression   . Diabetes mellitus without complication ([place])   . Difficult intubation   . Fatty liver   . GERD  (gastroesophageal reflux disease)   . Glaucoma   . Hypertension     Assessment/Plan: 1 Day Post-Op Procedure(s) (LRB): TOTAL HIP ARTHROPLASTY (Left) Active Problems:   Status post total replacement of hip  Estimated body mass index is 27.96 kg/m as calculated from the following:   Height as of this encounter: 5\' 5"  (1.651 m).   Weight as of this encounter: 76.2 kg (168 lb). Advance diet Up with therapy D/C IV fluids Plan for discharge tomorrow Discharge home with home health  Labs: Were reviewed DVT Prophylaxis - Lovenox, Foot Pumps and TED hose Weight-Bearing as tolerated to left leg Catheter has been removed today. Begin working on bowel movement  Jillyn Ledger. [place] 03/12/2017, 7:36 AM

## 2017-03-12 NOTE — Progress Notes (Signed)
Inpatient Diabetes Program Recommendations  AACE/ADA: New Consensus Statement on Inpatient Glycemic Control (2015)  Target Ranges:  Prepandial:   less than 140 mg/dL      Peak postprandial:   less than 180 mg/dL (1-2 hours)      Critically ill patients:  140 - 180 mg/dL   Results for Monica Rice, Monica Rice (MRN [id number]) as of 03/12/2017 10:49  Ref. Range 03/11/2017 10:46 03/11/2017 11:56 03/11/2017 15:57 03/11/2017 17:31 03/11/2017 21:03 03/11/2017 22:39 03/12/2017 08:24  Glucose-Capillary Latest Ref Range: 65 - 99 mg/dL 140 (H) 163 (H) 102 (H) 138 (H) 280 (H) 257 (H) 209 (H)   Review of Glycemic Control  Diabetes history: DM2 Outpatient Diabetes medications: 70/30 20 units QAM, 70/30 60 units QPM, Metformin 1000 mg BID Current orders for Inpatient glycemic control: 70/30 20 units QAM, 70/30 60 units QPM, Novolog 0-15 units TID with meals, Metformin 1000 mg BID  Inpatient Diabetes Program Recommendations:  Insulin - Basal: In reviewing chart, noted patient did NOT receive any 70/30 as an inpatient on 03/11/17. Please consider decreasing evening 70/30 to 20 units QPM with supper.  NOTE: Spoke with patient over the phone regarding DM control and outpatient DM medication regimen. Patient reports taking 70/30 20 units QAM and 70/30 60 units QPM but states that she increases dose by a couple units if CBGs are trending higher. Discussed current ordered DM medications as an inpatient. Informed patient a request to reduce evening 70/30 dose would be made.   Thanks, Barnie Alderman, RN, MSN, CDE Diabetes Coordinator Inpatient Diabetes Program [phone] (Team Pager from 8am to 5pm)

## 2017-03-12 NOTE — Evaluation (Signed)
Physical Therapy Evaluation Patient Details Name: Monica Rice MRN: [id number] DOB: July 05, 1950 Today's Date: 03/12/2017   History of Present Illness  Pt underwent L THR posterior approach without reported post-op complications. She is POD#1 at time of PT evaluation.  Clinical Impression  Pt admitted with above diagnosis. Pt currently with functional limitations due to the deficits listed below (see PT Problem List). Pt demonstrates good performance with PT evaluation. She is able to complete all supine exercises as instructed with some assist for SLR. She requires minA+1 for bed mobility but CGA only for transfers and ambulation.  She ambulates with partial step-through pattern utilizing rolling walker for UE support. Cues and education provided regarding proper sequencing with walker and for turns. No LLE buckling noted during ambulation. She is able to ambulate almost to the door and back to the recliner. She will be appropriate to return home at discharge with family support and [facility]. No DME needs. Pt will benefit from PT services to address deficits in strength, balance, and mobility in order to return to full function at home.       Follow Up Recommendations Home health PT    Equipment Recommendations  None recommended by PT    Recommendations for Other Services OT consult     Precautions / Restrictions Precautions Precautions: Fall;Posterior Hip Precaution Booklet Issued: Yes (comment) Restrictions Weight Bearing Restrictions: Yes LLE Weight Bearing: Weight bearing as tolerated      Mobility  Bed Mobility Overal bed mobility: Needs Assistance Bed Mobility: Supine to Sit     Supine to sit: Min assist     General bed mobility comments: Pt requires HOB elevated, bed rails, and RUE support to come to sitting. Cues to avoid violating hip precautions during bed mobility  Transfers Overall transfer level: Needs assistance Equipment used: Rolling walker (2  wheeled) Transfers: Sit to/from Stand Sit to Stand: Min guard         General transfer comment: Pt requires cues for safe hand placement and to avoid violating hip precautions. Decreased weight shift to LLE but good stability once upright in standing  Ambulation/Gait Ambulation/Gait assistance: Min guard Ambulation Distance (Feet): 50 Feet Assistive device: Rolling walker (2 wheeled) Gait Pattern/deviations: Decreased step length - right;Decreased step length - left;Decreased stance time - left;Decreased weight shift to left Gait velocity: Decreased Gait velocity interpretation: <1.8 ft/sec, indicative of risk for recurrent falls General Gait Details: Pt ambulates with partial step-through pattern utilizing rolling walker for UE support. Cues and education provided regarding proper sequencing with walker and for turns. No LLE buckling noted during ambulation.   Stairs            Wheelchair Mobility    Modified Rankin (Stroke Patients Only)       Balance Overall balance assessment: Needs assistance Sitting-balance support: No upper extremity supported Sitting balance-Leahy Scale: Good     Standing balance support: Bilateral upper extremity supported Standing balance-Leahy Scale: Fair                               Pertinent Vitals/Pain Pain Assessment: 0-10 Pain Score: 2  Pain Location: L hip Pain Descriptors / Indicators: Aching Pain Intervention(s): Premedicated before session;Monitored during session    Home Living Family/patient expects to be discharged to:: Private residence Living Arrangements: Spouse/significant other Available Help at Discharge: Family Type of Home: House Home Access: Stairs to enter Entrance Stairs-Rails: None (Post she can hold with  both hands) Entrance Stairs-Number of Steps: 3 Home Layout: Multi-level;Able to live on main level with bedroom/bathroom Home Equipment: Toilet riser;Shower seat;Walker - 2 wheels;Cane -  single point (no BSC, no wc)      Prior Function Level of Independence: Independent         Comments: Independent with ADLs/IADLs. Full community ambulator without assistive device. No falls. Drives     Hand Dominance   Dominant Hand: Right    Extremity/Trunk Assessment   Upper Extremity Assessment Upper Extremity Assessment: Overall WFL for tasks assessed    Lower Extremity Assessment Lower Extremity Assessment: LLE deficits/detail LLE Deficits / Details: Pt requires assist for L SLR due to weakness and pain. Full DF/PF. Able to perform full SAQ without assistance. Reports intact sensation throughout entire LLE       Communication   Communication: No difficulties  Cognition Arousal/Alertness: Awake/alert Behavior During Therapy: WFL for tasks assessed/performed Overall Cognitive Status: Within Functional Limits for tasks assessed                                        General Comments      Exercises Total Joint Exercises Ankle Circles/Pumps: AROM;Both;10 reps;Supine Quad Sets: Strengthening;Both;10 reps;Supine Gluteal Sets: Strengthening;Both;10 reps;Supine Towel Squeeze: Strengthening;Both;10 reps;Supine Short Arc Quad: Strengthening;Left;10 reps;Supine Heel Slides: Strengthening;Left;10 reps;Supine Hip ABduction/ADduction: Strengthening;Left;10 reps;Supine Straight Leg Raises: Strengthening;Left;10 reps;Supine   Assessment/Plan    PT Assessment Patient needs continued PT services  PT Problem List Decreased range of motion;Decreased strength;Decreased activity tolerance;Decreased balance;Decreased mobility;Pain       PT Treatment Interventions DME instruction;Stair training;Gait training;Functional mobility training;Therapeutic activities;Therapeutic exercise;Neuromuscular re-education;Balance training;Patient/family education;Manual techniques    PT Goals (Current goals can be found in the Care Plan section)  Acute Rehab PT Goals Patient  Stated Goal: Return to prior function at home PT Goal Formulation: With patient/family Time For Goal Achievement: 03/26/17 Potential to Achieve Goals: Good    Frequency BID   Barriers to discharge        Co-evaluation               AM-PAC PT "6 Clicks" Daily Activity  Outcome Measure Difficulty turning over in bed (including adjusting bedclothes, sheets and blankets)?: A Little Difficulty moving from lying on back to sitting on the side of the bed? : Unable Difficulty sitting down on and standing up from a chair with arms (e.g., wheelchair, bedside commode, etc,.)?: A Little Help needed moving to and from a bed to chair (including a wheelchair)?: A Little Help needed walking in hospital room?: A Little Help needed climbing 3-5 steps with a railing? : A Lot 6 Click Score: 15    End of Session Equipment Utilized During Treatment: Gait belt Activity Tolerance: Patient tolerated treatment well Patient left: in chair;with call bell/phone within reach;with chair alarm set;with family/visitor present;with SCD's reapplied;Other (comment) (ice pack on hip, abduction pillow between knees)   PT Visit Diagnosis: Unsteadiness on feet (R26.81);Muscle weakness (generalized) (M62.81);Difficulty in walking, not elsewhere classified (R26.2);Pain Pain - Right/Left: Left Pain - part of body: Hip    Time: 0623-7628 PT Time Calculation (min) (ACUTE ONLY): 30 min   Charges:   PT Evaluation $PT Eval Low Complexity: 1 Low PT Treatments $Therapeutic Exercise: 8-22 mins   PT G Codes:   PT G-Codes **NOT FOR INPATIENT CLASS** Functional Assessment Tool Used: AM-PAC 6 Clicks Basic Mobility Functional Limitation: Mobility: Walking and moving  around Mobility: Walking and Moving Around Current Status [phone]): At least 40 percent but less than 60 percent impaired, limited or restricted Mobility: Walking and Moving Around Goal Status [phone]): At least 1 percent but less than 20 percent impaired,  limited or restricted    Phillips Grout PT, DPT    Huprich,Jason 03/12/2017, 11:34 AM

## 2017-03-12 NOTE — Progress Notes (Signed)
Clinical Social Worker (CSW) received SNF consult. PT is recommending home health. RN case manager aware of above. Please reconsult if future social work needs arise. CSW signing off.   Nayquan Evinger, LCSW [phone] 

## 2017-03-13 ENCOUNTER — Encounter: Payer: Self-pay | Admitting: Orthopedic Surgery

## 2017-03-13 LAB — BASIC METABOLIC PANEL
ANION GAP: 5 (ref 5–15)
BUN: 9 mg/dL (ref 6–20)
CO2: 29 mmol/L (ref 22–32)
Calcium: 8.3 mg/dL — ABNORMAL LOW (ref 8.9–10.3)
Chloride: 100 mmol/L — ABNORMAL LOW (ref 101–111)
Creatinine, Ser: 0.64 mg/dL (ref 0.44–1.00)
GLUCOSE: 253 mg/dL — AB (ref 65–99)
POTASSIUM: 3.6 mmol/L (ref 3.5–5.1)
SODIUM: 134 mmol/L — AB (ref 135–145)

## 2017-03-13 LAB — CBC
HCT: 36 % (ref 35.0–47.0)
Hemoglobin: 12.3 g/dL (ref 12.0–16.0)
MCH: 30.4 pg (ref 26.0–34.0)
MCHC: 34.3 g/dL (ref 32.0–36.0)
MCV: 88.6 fL (ref 80.0–100.0)
PLATELETS: 135 10*3/uL — AB (ref 150–440)
RBC: 4.06 MIL/uL (ref 3.80–5.20)
RDW: 13.2 % (ref 11.5–14.5)
WBC: 8.7 10*3/uL (ref 3.6–11.0)

## 2017-03-13 LAB — GLUCOSE, CAPILLARY
GLUCOSE-CAPILLARY: 231 mg/dL — AB (ref 65–99)
GLUCOSE-CAPILLARY: 274 mg/dL — AB (ref 65–99)

## 2017-03-13 LAB — SURGICAL PATHOLOGY

## 2017-03-13 MED ORDER — LACTULOSE 10 GM/15ML PO SOLN
10.0000 g | Freq: Two times a day (BID) | ORAL | Status: DC | PRN
  Administered 2017-03-13 (×2): 10 g via ORAL
  Filled 2017-03-13 (×2): qty 30

## 2017-03-13 NOTE — Care Management Important Message (Signed)
Important Message  Patient Details  Name: Monica Rice MRN: [id number] Date of Birth: [birth year]   Medicare Important Message Given:  Yes Gave patient copy of signed notice that was provided at time of pre registration   Katrina Stack, RN 03/13/2017, 9:31 AM

## 2017-03-13 NOTE — Care Management (Signed)
Notified [facility] of discharge today.

## 2017-03-13 NOTE — Progress Notes (Signed)
   Subjective: 2 Days Post-Op Procedure(s) (LRB): TOTAL HIP ARTHROPLASTY (Left) Patient reports pain as moderate.   Patient is well, and has had no acute complaints or problems Patient did well with therapy yesterday. Plan is to go Home after hospital stay. no nausea and no vomiting Patient denies any chest pains or shortness of breath. Objective: Vital signs in last 24 hours: Temp:  [98.6 F (37 C)-100.4 F (38 C)] 100.4 F (38 C) (10/12 0729) Pulse Rate:  [86-103] 103 (10/12 0729) Resp:  [14-20] 14 (10/12 0729) BP: (121-157)/(56-69) 125/68 (10/12 0729) SpO2:  [89 %-94 %] 90 % (10/12 0729) well approximated incision Heels are non tender and elevated off the bed using rolled towels Intake/Output from previous day: 10/11 0701 - 10/12 0700 In: 480 [P.O.:480] Out: 1490 [Urine:1450; Drains:40] Intake/Output this shift: No intake/output data recorded.   Recent Labs  03/12/17 0421 03/13/17 0437  HGB 11.7* 12.3    Recent Labs  03/12/17 0421 03/13/17 0437  WBC 9.8 8.7  RBC 3.83 4.06  HCT 33.5* 36.0  PLT 168 135*    Recent Labs  03/12/17 0421 03/13/17 0437  NA 135 134*  K 3.9 3.6  CL 103 100*  CO2 27 29  BUN 12 9  CREATININE 0.48 0.64  GLUCOSE 232* 253*  CALCIUM 8.1* 8.3*   No results for input(s): LABPT, INR in the last 72 hours.  EXAM General - Patient is Alert, Appropriate and Oriented Extremity - Neurologically intact Neurovascular intact Sensation intact distally Intact pulses distally Dorsiflexion/Plantar flexion intact No cellulitis present Compartment soft Dressing - dressing C/D/I Motor Function - intact, moving foot and toes well on exam.    Past Medical History:  Diagnosis Date  . Anemia   . Anxiety   . Cancer ([place])    skin ca  . Complication of anesthesia    "difficult to wake up"  . DDD (degenerative disc disease), lumbar   . Depression   . Diabetes mellitus without complication ([place])   . Difficult intubation   . Fatty liver    . GERD (gastroesophageal reflux disease)   . Glaucoma   . Hypertension     Assessment/Plan: 2 Days Post-Op Procedure(s) (LRB): TOTAL HIP ARTHROPLASTY (Left) Active Problems:   Status post total replacement of hip  Estimated body mass index is 27.96 kg/m as calculated from the following:   Height as of this encounter: 5\' 5"  (1.651 m).   Weight as of this encounter: 76.2 kg (168 lb). Up with therapy Discharge home with home health  Labs: Were reviewed and acceptable DVT Prophylaxis - Lovenox, Foot Pumps and TED hose Weight-Bearing as tolerated to left leg Hemovac discontinued on today's visit Patient needs a bowel movement today. We'll add lactulose. Patient needs to do the lap around the nurse's desk and steps. Patient may be discharged to home once she has a bowel movement and does the required discharge protocol.  Jillyn Ledger. Rathdrum Laramie 03/13/2017, 7:38 AM

## 2017-03-13 NOTE — Progress Notes (Signed)
Inpatient Diabetes Program Recommendations  AACE/ADA: New Consensus Statement on Inpatient Glycemic Control (2015)  Target Ranges:  Prepandial:   less than 140 mg/dL      Peak postprandial:   less than 180 mg/dL (1-2 hours)      Critically ill patients:  140 - 180 mg/dL   Lab Results  Component Value Date   GLUCAP 231 (H) 03/13/2017   HGBA1C 7.3 (H) 02/20/2017    Review of Glycemic Control   Results for Monica Rice, Monica Rice (MRN [id number]) as of 03/13/2017 10:20  Ref. Range 03/12/2017 08:24 03/12/2017 13:13 03/12/2017 17:21 03/12/2017 22:32 03/13/2017 07:55  Glucose-Capillary Latest Ref Range: 65 - 99 mg/dL 209 (H) 179 (H) 192 (H) 189 (H) 231 (H)    Diabetes history: DM2 Outpatient Diabetes medications: 70/30 20 units QAM, 70/30 60 units QPM, Metformin 1000 mg BID  Current orders for Inpatient glycemic control: 70/30 20 units QAM, 70/30 200 units QPM, Novolog 0-15 units TID with meals, Metformin 1000 mg BID  Inpatient Diabetes Program Recommendations: Consider increasing pm Novolog 70/30 to 25 units (hs and fasting blood sugar elevated).  Gentry Fitz, RN, BA, MHA, CDE Diabetes Coordinator Inpatient Diabetes Program  [phone] (Team Pager) [phone] ([place]) 03/13/2017 10:22 AM

## 2017-03-13 NOTE — Progress Notes (Signed)
Patient is alert and oriented and able to verbalize needs. No complaints of pain at this time. Patient had a BM after lunch. VSS. PIV removed. Discharge instructions gone over with patient at this time. Printed AVS and rx for Oxycodone and Tramadol given to patient at this time. Patient verbalizes understanding of where to pick up prescribed Lovenox. Follow up appointments made and gone over with patient. No concerns voiced at current time. Husband on the way to pick up patient and take home. All belongings packed up by NT.   Bethann Punches, RN

## 2017-03-13 NOTE — Progress Notes (Signed)
Physical Therapy Treatment Patient Details Name: Monica Rice MRN: [id number] DOB: May 16, 1951 Today's Date: 03/13/2017    History of Present Illness Pt underwent L THR posterior approach without reported post-op complications. She is POD#1 at time of OT evaluation.    PT Comments    Pt continues to require Min A for bed mobility and struggles with elevating trunk off bed.  Pt reports her husband will be able to physically assist at home with mobility and stairs as needed.  Pt increased gait distance to 200' with RW and able to ascend/descend 4 steps with supervision.  Pt continues to need review of hip precautions.  Education provided on car transfer and reviewed THA handout.  Continue with current POC.   Follow Up Recommendations  Home health PT     Equipment Recommendations  None recommended by PT    Recommendations for Other Services OT consult     Precautions / Restrictions Precautions Precautions: Fall;Posterior Hip Precaution Comments: Reviewed hip precuations during functional mobility; continues to require cues, especially for bending precautions Restrictions Weight Bearing Restrictions: Yes LLE Weight Bearing: Weight bearing as tolerated    Mobility  Bed Mobility Overal bed mobility: Needs Assistance Bed Mobility: Supine to Sit     Supine to sit: Min assist     General bed mobility comments: HOB lowered to 10 degrees, discouraged use of bed rails but pt needing UE assist to elevate trunk, unable to support self on elbows or extended arms wihtout posterior LOB; once stabilized pt able to scoot to EOB with supervision.  Transfers Overall transfer level: Needs assistance Equipment used: Rolling walker (2 wheeled) Transfers: Sit to/from Stand Sit to Stand: Supervision         General transfer comment: verbal cues for posterior THA precautions  Ambulation/Gait Ambulation/Gait assistance: Supervision Ambulation Distance (Feet): 200 Feet Assistive  device: Rolling walker (2 wheeled) Gait Pattern/deviations: Decreased step length - right;Decreased step length - left;Decreased stance time - left;Decreased weight shift to left     General Gait Details: Pt amb with slow steady gait, partial step through gait pattern with cue to keep L toes pointing forward.   Stairs Stairs: Yes   Stair Management: Two rails;Step to pattern Number of Stairs: 4 General stair comments: initial instruction for stair sequencing then supervision assist for ascending and descending with good carry over of sequence.  Wheelchair Mobility    Modified Rankin (Stroke Patients Only)       Balance                                            Cognition Arousal/Alertness: Awake/alert Behavior During Therapy: WFL for tasks assessed/performed Overall Cognitive Status: Within Functional Limits for tasks assessed                                        Exercises Total Joint Exercises Ankle Circles/Pumps: Strengthening;10 reps;Seated Quad Sets: Strengthening;Both;10 reps;Supine Gluteal Sets: Strengthening;Both;10 reps;Supine Short Arc Quad: Strengthening;Left;10 reps Heel Slides: Strengthening;Left;10 reps;Seated    General Comments General comments (skin integrity, edema, etc.): surgical dressing intact      Pertinent Vitals/Pain Pain Location: L hip Pain Descriptors / Indicators: Aching;Sore Pain Intervention(s): Monitored during session    Home Living  Prior Function            PT Goals (current goals can now be found in the care plan section) Acute Rehab PT Goals Patient Stated Goal: Return to prior function at home PT Goal Formulation: With patient Time For Goal Achievement: 03/26/17 Potential to Achieve Goals: Good Progress towards PT goals: Progressing toward goals    Frequency    BID      PT Plan Current plan remains appropriate    Co-evaluation               AM-PAC PT "6 Clicks" Daily Activity  Outcome Measure  Difficulty turning over in bed (including adjusting bedclothes, sheets and blankets)?: A Little Difficulty moving from lying on back to sitting on the side of the bed? : A Lot Difficulty sitting down on and standing up from a chair with arms (e.g., wheelchair, bedside commode, etc,.)?: A Little Help needed moving to and from a bed to chair (including a wheelchair)?: A Little Help needed walking in hospital room?: A Little Help needed climbing 3-5 steps with a railing? : A Little 6 Click Score: 17    End of Session Equipment Utilized During Treatment: Gait belt Activity Tolerance: Patient tolerated treatment well Patient left: in chair Nurse Communication: Mobility status PT Visit Diagnosis: Unsteadiness on feet (R26.81);Muscle weakness (generalized) (M62.81);Difficulty in walking, not elsewhere classified (R26.2);Pain Pain - Right/Left: Left Pain - part of body: Hip     Time: 0349-1791 PT Time Calculation (min) (ACUTE ONLY): 43 min  Charges:  $Gait Training: 23-37 mins $Therapeutic Exercise: 8-22 mins                    G Codes:       Monica Rice A Monica Rice, PT 03/13/2017, 11:53 AM

## 2017-03-30 ENCOUNTER — Other Ambulatory Visit: Payer: Self-pay | Admitting: Internal Medicine

## 2017-03-30 DIAGNOSIS — Z1231 Encounter for screening mammogram for malignant neoplasm of breast: Secondary | ICD-10-CM

## 2017-05-11 ENCOUNTER — Ambulatory Visit: Payer: Medicare Other

## 2017-05-20 ENCOUNTER — Ambulatory Visit
Admission: RE | Admit: 2017-05-20 | Discharge: 2017-05-20 | Disposition: A | Discharge status: Home / Self Care | Payer: Medicare Other | Source: Ambulatory Visit | Attending: Internal Medicine | Admitting: Internal Medicine

## 2017-05-20 DIAGNOSIS — Z1231 Encounter for screening mammogram for malignant neoplasm of breast: Secondary | ICD-10-CM | POA: Diagnosis present

## 2018-01-07 ENCOUNTER — Other Ambulatory Visit: Payer: Self-pay | Admitting: Internal Medicine

## 2018-01-07 DIAGNOSIS — R7989 Other specified abnormal findings of blood chemistry: Secondary | ICD-10-CM

## 2018-01-07 DIAGNOSIS — R945 Abnormal results of liver function studies: Secondary | ICD-10-CM

## 2018-01-20 ENCOUNTER — Ambulatory Visit: Payer: Medicare Other

## 2018-01-25 ENCOUNTER — Encounter
Admission: RE | Disposition: A | Discharge status: Home / Self Care | Payer: Self-pay | Source: Ambulatory Visit | Attending: Unknown Physician Specialty

## 2018-01-25 ENCOUNTER — Ambulatory Visit
Admission: RE | Admit: 2018-01-25 | Discharge: 2018-01-25 | Disposition: A | Discharge status: Home / Self Care | Payer: Medicare Other | Source: Ambulatory Visit | Attending: Unknown Physician Specialty | Admitting: Unknown Physician Specialty

## 2018-01-25 ENCOUNTER — Ambulatory Visit: Payer: Medicare Other | Admitting: Anesthesiology

## 2018-01-25 ENCOUNTER — Encounter: Payer: Self-pay | Admitting: *Deleted

## 2018-01-25 DIAGNOSIS — K760 Fatty (change of) liver, not elsewhere classified: Secondary | ICD-10-CM | POA: Insufficient documentation

## 2018-01-25 DIAGNOSIS — K219 Gastro-esophageal reflux disease without esophagitis: Secondary | ICD-10-CM | POA: Insufficient documentation

## 2018-01-25 DIAGNOSIS — E785 Hyperlipidemia, unspecified: Secondary | ICD-10-CM | POA: Diagnosis not present

## 2018-01-25 DIAGNOSIS — Z95812 Presence of fully implantable artificial heart: Secondary | ICD-10-CM | POA: Diagnosis not present

## 2018-01-25 DIAGNOSIS — Z85828 Personal history of other malignant neoplasm of skin: Secondary | ICD-10-CM | POA: Diagnosis not present

## 2018-01-25 DIAGNOSIS — F329 Major depressive disorder, single episode, unspecified: Secondary | ICD-10-CM | POA: Diagnosis not present

## 2018-01-25 DIAGNOSIS — E1151 Type 2 diabetes mellitus with diabetic peripheral angiopathy without gangrene: Secondary | ICD-10-CM | POA: Diagnosis not present

## 2018-01-25 DIAGNOSIS — F419 Anxiety disorder, unspecified: Secondary | ICD-10-CM | POA: Insufficient documentation

## 2018-01-25 DIAGNOSIS — D123 Benign neoplasm of transverse colon: Secondary | ICD-10-CM | POA: Diagnosis not present

## 2018-01-25 DIAGNOSIS — Z1211 Encounter for screening for malignant neoplasm of colon: Secondary | ICD-10-CM | POA: Diagnosis not present

## 2018-01-25 DIAGNOSIS — G4710 Hypersomnia, unspecified: Secondary | ICD-10-CM | POA: Insufficient documentation

## 2018-01-25 DIAGNOSIS — Z86010 Personal history of colonic polyps: Secondary | ICD-10-CM | POA: Diagnosis not present

## 2018-01-25 DIAGNOSIS — G4739 Other sleep apnea: Secondary | ICD-10-CM | POA: Diagnosis not present

## 2018-01-25 DIAGNOSIS — Z79899 Other long term (current) drug therapy: Secondary | ICD-10-CM | POA: Insufficient documentation

## 2018-01-25 DIAGNOSIS — Z7982 Long term (current) use of aspirin: Secondary | ICD-10-CM | POA: Insufficient documentation

## 2018-01-25 DIAGNOSIS — Z7984 Long term (current) use of oral hypoglycemic drugs: Secondary | ICD-10-CM | POA: Diagnosis not present

## 2018-01-25 DIAGNOSIS — Z7901 Long term (current) use of anticoagulants: Secondary | ICD-10-CM | POA: Insufficient documentation

## 2018-01-25 DIAGNOSIS — I10 Essential (primary) hypertension: Secondary | ICD-10-CM | POA: Insufficient documentation

## 2018-01-25 HISTORY — DX: Personal history of other infectious and parasitic diseases: Z86.19

## 2018-01-25 HISTORY — DX: Hyperlipidemia, unspecified: E78.5

## 2018-01-25 HISTORY — DX: Cervicalgia: M54.2

## 2018-01-25 HISTORY — PX: COLONOSCOPY WITH PROPOFOL: SHX5780

## 2018-01-25 HISTORY — DX: Hypersomnia, unspecified: G47.10

## 2018-01-25 HISTORY — DX: Sleep apnea, unspecified: G47.30

## 2018-01-25 HISTORY — DX: Other seasonal allergic rhinitis: J30.2

## 2018-01-25 HISTORY — DX: Peripheral vascular disease, unspecified: I73.9

## 2018-01-25 LAB — GLUCOSE, CAPILLARY: Glucose-Capillary: 198 mg/dL — ABNORMAL HIGH (ref 70–99)

## 2018-01-25 SURGERY — COLONOSCOPY WITH PROPOFOL
Anesthesia: General

## 2018-01-25 MED ORDER — SODIUM CHLORIDE 0.9 % IV SOLN
INTRAVENOUS | Status: DC
  Administered 2018-01-25: 1000 mL via INTRAVENOUS

## 2018-01-25 MED ORDER — PROPOFOL 500 MG/50ML IV EMUL
INTRAVENOUS | Status: DC | PRN
  Administered 2018-01-25: 100 ug/kg/min via INTRAVENOUS

## 2018-01-25 MED ORDER — MIDAZOLAM HCL 2 MG/2ML IJ SOLN
INTRAMUSCULAR | Status: DC | PRN
  Administered 2018-01-25: 2 mg via INTRAVENOUS

## 2018-01-25 MED ORDER — FENTANYL CITRATE (PF) 100 MCG/2ML IJ SOLN
INTRAMUSCULAR | Status: AC
  Filled 2018-01-25: qty 2

## 2018-01-25 MED ORDER — MIDAZOLAM HCL 2 MG/2ML IJ SOLN
INTRAMUSCULAR | Status: AC
  Filled 2018-01-25: qty 2

## 2018-01-25 MED ORDER — PHENYLEPHRINE HCL 10 MG/ML IJ SOLN
INTRAMUSCULAR | Status: AC
  Filled 2018-01-25: qty 1

## 2018-01-25 MED ORDER — PIPERACILLIN-TAZOBACTAM 3.375 G IVPB
3.3750 g | Freq: Once | INTRAVENOUS | Status: AC
  Administered 2018-01-25: 3.375 g via INTRAVENOUS

## 2018-01-25 MED ORDER — PHENYLEPHRINE HCL 10 MG/ML IJ SOLN
INTRAMUSCULAR | Status: DC | PRN
  Administered 2018-01-25: 50 ug via INTRAVENOUS

## 2018-01-25 MED ORDER — FENTANYL CITRATE (PF) 100 MCG/2ML IJ SOLN
INTRAMUSCULAR | Status: DC | PRN
  Administered 2018-01-25: 50 ug via INTRAVENOUS

## 2018-01-25 MED ORDER — PROPOFOL 500 MG/50ML IV EMUL
INTRAVENOUS | Status: AC
  Filled 2018-01-25: qty 50

## 2018-01-25 NOTE — H&P (Signed)
Primary Care Physician:  Tracie Harrier, MD Primary Gastroenterologist:  Dr. Vira Agar  Pre-Procedure History & Physical: HPI:  Monica Rice is a 67 y.o. female is here for an colonoscopy.  Done for personal history of colon polyps   Past Medical History:  Diagnosis Date  . Anemia   . Anxiety   . Cancer ([place])    skin ca  . Cervicalgia   . Colon adenomas   . Complication of anesthesia    "difficult to wake up"  . DDD (degenerative disc disease), lumbar   . DDD (degenerative disc disease), lumbar   . Depression   . Diabetes mellitus without complication ([place])   . Difficult intubation   . Fatty liver   . GERD (gastroesophageal reflux disease)   . Glaucoma   . H/O scarlet fever   . Hyperlipidemia   . Hypersomnia with sleep apnea   . Hypertension   . Peripheral vascular disease ([place])   . Seasonal allergies   . Sleep apnea     Past Surgical History:  Procedure Laterality Date  . APPENDECTOMY    . CARPAL TUNNEL RELEASE Bilateral   . CATARACT EXTRACTION, BILATERAL    . CESAREAN SECTION     x2   . CHOLECYSTECTOMY    . COLONOSCOPY    . ESOPHAGOGASTRODUODENOSCOPY    . HERNIA REPAIR    . JOINT REPLACEMENT Bilateral   . LAPAROSCOPY N/A 01/25/2016   Procedure: LAPAROSCOPY DIAGNOSTIC;  Surgeon: Jules Husbands, MD;  Location: [facility] ORS;  Service: General;  Laterality: N/A;  . LAPAROTOMY N/A 01/25/2016   Procedure: EXPLORATORY LAPAROTOMY;  Surgeon: Jules Husbands, MD;  Location: [facility] ORS;  Service: General;  Laterality: N/A;  . TOTAL HIP ARTHROPLASTY Left 03/11/2017   Procedure: TOTAL HIP ARTHROPLASTY;  Surgeon: Dereck Leep, MD;  Location: [facility] ORS;  Service: Orthopedics;  Laterality: Left;  . tumor rt foot Right   . varicose      Prior to Admission medications   Medication Sig Start Date End Date Taking? Authorizing Provider  ACCU-CHEK AVIVA PLUS test strip  12/20/15  Yes [provider]  amLODipine (NORVASC) 5 MG tablet Take 5 mg by mouth daily.  01/17/16   Yes [provider]  aspirin 81 MG EC tablet Take 81 mg by mouth daily. Swallow whole.   Yes [provider]  atorvastatin (LIPITOR) 40 MG tablet Take 40 mg by mouth at bedtime. 02/05/17  Yes [provider]  Biotin w/ Vitamins C & E (HAIR/SKIN/NAILS PO) Take 1 tablet by mouth 2 (two) times daily.   Yes [provider]  buPROPion (WELLBUTRIN XL) 150 MG 24 hr tablet Take 150 mg by mouth at bedtime.  02/11/17  Yes [provider]  Calcium Carb-Cholecalciferol (CALCIUM 600 + D PO) Take 1 capsule by mouth daily. Vitamin d3 800iu   Yes [provider]  calcium-vitamin D (OSCAL WITH D) 500-200 MG-UNIT tablet Take 1 tablet by mouth daily.   Yes [provider]  Cinnamon 500 MG capsule Take 1,000 mg by mouth 2 (two) times daily.    Yes [provider]  clonazePAM (KLONOPIN) 0.5 MG tablet Take 0.5 mg by mouth daily as needed for anxiety.  01/26/15  Yes [provider]  colestipol (COLESTID) 1 g tablet Take 1 g by mouth daily.   Yes [provider]  Dulaglutide 0.75 MG/0.5ML SOPN Inject into the skin once a week.   Yes [provider]  ferrous sulfate 325 (65 FE)  MG tablet TAKE ONE TABLET BY MOUTH EVERY MORNING WITH BREAKFAST 12/28/15  Yes [provider]  fluticasone (FLONASE) 50 MCG/ACT nasal spray Place 2 sprays into both nostrils daily as needed for allergies. 01/30/17  Yes [provider]  gabapentin (NEURONTIN) 100 MG capsule Take 100 mg by mouth at bedtime. 02/11/17  Yes [provider]  glucose blood test strip Use 3 (three) times daily. Use as instructed. 02/07/16  Yes [provider]  hydrochlorothiazide (HYDRODIURIL) 25 MG tablet Take 25 mg by mouth daily.  11/23/15  Yes [provider]  insulin NPH-regular Human (NOVOLIN 70/30) (70-30) 100 UNIT/ML injection Inject 20-60 Units into the skin 2 (two) times daily before a meal. Take 20 units in the morning and 60 units  in the evening.   Yes [provider]  latanoprost (XALATAN) 0.005 % ophthalmic solution Place 1 drop into both eyes at bedtime.  01/19/16  Yes [provider]  Magnesium 250 MG TABS Take by mouth.   Yes [provider]  meloxicam (MOBIC) 7.5 MG tablet Take 7.5 mg by mouth 2 (two) times daily as needed for pain.  08/15/15  Yes [provider]  metFORMIN (GLUCOPHAGE) 1000 MG tablet Take 1,000 mg by mouth 2 (two) times daily with a meal.  01/03/16  Yes [provider]  Multiple Vitamins-Minerals (HAIR/SKIN/NAILS) TABS Take 1 tablet by mouth 2 (two) times daily.   Yes [provider]  Multiple Vitamins-Minerals (MULTIVITAMIN ADULT PO) Take 1 tablet by mouth daily.   Yes [provider]  omeprazole (PRILOSEC) 20 MG capsule Take 20 mg by mouth daily.  08/15/15  Yes [provider]  vitamin B-12 (CYANOCOBALAMIN) 1000 MCG tablet Take 1,000 mcg by mouth daily. 12/28/15  Yes [provider]  enoxaparin (LOVENOX) 40 MG/0.4ML injection Inject 0.4 mLs (40 mg total) into the skin daily. Patient not taking: Reported on 01/22/2018 03/12/17   Watt Climes, PA  FLUoxetine (PROZAC) 20 MG capsule Take 40 mg by mouth daily.  01/03/16   [provider]  oxyCODONE (OXY IR/ROXICODONE) 5 MG immediate release tablet Take 1-2 tablets (5-10 mg total) by mouth every 4 (four) hours as needed for severe pain. Patient not taking: Reported on 01/22/2018 03/12/17   Watt Climes, PA  traMADol (ULTRAM) 50 MG tablet Take 1-2 tablets (50-100 mg total) by mouth every 4 (four) hours as needed for moderate pain. 03/12/17   Watt Climes, PA    Allergies as of 11/06/2017 - Review Complete 03/11/2017  Allergen Reaction Noted  . Hydrocodone Other (See Comments) 02/19/2017  . Neomycin-bacitracin-polymyxin [bacitracin-neomycin-polymyxin] Rash and Other (See Comments) 11/16/2013  . Sulfa antibiotics Rash and Other (See Comments) 03/01/2014    Family History   Problem Relation Age of Onset  . COPD Mother   . Hyperlipidemia Mother   . Heart disease Mother   . Kidney disease Father   . Heart disease Father   . Hypertension Sister   . Skin cancer Sister   . Breast cancer Neg Hx     Social History   Socioeconomic History  . Marital status: Married    Spouse name: luther  . Number of children: 2  . Years of education: 57  . Highest education level: Not on file  Occupational History  . Not on file  Social Needs  . Financial resource strain: Somewhat hard  . Food insecurity:    Worry: Patient refused    Inability: Patient refused  . Transportation needs:  Medical: Yes    Non-medical: Yes  Tobacco Use  . Smoking status: Never Smoker  . Smokeless tobacco: Never Used  Substance and Sexual Activity  . Alcohol use: No  . Drug use: No  . Sexual activity: Not on file  Lifestyle  . Physical activity:    Days per week: 4 days    Minutes per session: 30 min  . Stress: Not at all  Relationships  . Social connections:    Talks on phone: More than three times a week    Gets together: More than three times a week    Attends religious service: More than 4 times per year    Active member of club or organization: Yes    Attends meetings of clubs or organizations: More than 4 times per year    Relationship status: Married  . Intimate partner violence:    Fear of current or ex partner: Not on file    Emotionally abused: Not on file    Physically abused: Not on file    Forced sexual activity: Not on file  Other Topics Concern  . Not on file  Social History Narrative  . Not on file    Review of Systems: See HPI, otherwise negative ROS  Physical Exam: BP 131/83   Pulse 81   Temp (!) 96.5 F (35.8 C) (Tympanic)   Resp 20   Ht 5\' 6"  (1.676 m)   Wt 80.7 kg   SpO2 96%   BMI 28.73 kg/m  General:   Alert,  pleasant and cooperative in NAD Head:  Normocephalic and atraumatic. Neck:  Supple; no masses or thyromegaly. Lungs:   Clear throughout to auscultation.    Heart:  Regular rate and rhythm. Abdomen:  Soft, nontender and nondistended. Normal bowel sounds, without guarding, and without rebound.   Neurologic:  Alert and  oriented x4;  grossly normal neurologically.  Impression/Plan: Monica Rice is here for an colonoscopy to be performed for [facility] colon polyps.  Risks, benefits, limitations, and alternatives regarding  colonoscopy have been reviewed with the patient.  Questions have been answered.  All parties agreeable.   Gaylyn Cheers, MD  01/25/2018, 2:33 PM

## 2018-01-25 NOTE — Anesthesia Post-op Follow-up Note (Signed)
Anesthesia QCDR form completed.        

## 2018-01-25 NOTE — Anesthesia Postprocedure Evaluation (Signed)
Anesthesia Post Note  Patient: Monica Rice  Procedure(s) Performed: COLONOSCOPY WITH PROPOFOL (N/A )  Patient location during evaluation: Endoscopy Anesthesia Type: General Level of consciousness: awake and alert Pain management: pain level controlled Vital Signs Assessment: post-procedure vital signs reviewed and stable Respiratory status: spontaneous breathing and respiratory function stable Cardiovascular status: stable Anesthetic complications: no     Last Vitals:  Vitals:   01/25/18 1346 01/25/18 1522  BP: 131/83   Pulse: 81   Resp: 20   Temp: (!) 35.8 C (!) 36.2 C  SpO2: 96%     Last Pain:  Vitals:   01/25/18 1346  TempSrc: Tympanic                 Alea Ryer K

## 2018-01-25 NOTE — Anesthesia Preprocedure Evaluation (Signed)
Anesthesia Evaluation  Patient identified by MRN, date of birth, ID band Patient awake    Reviewed: Allergy & Precautions, NPO status , Patient's Chart, lab work & pertinent test results  History of Anesthesia Complications (+) DIFFICULT AIRWAY  Airway Mallampati: II       Dental   Pulmonary sleep apnea and Continuous Positive Airway Pressure Ventilation , neg COPD,           Cardiovascular hypertension, Pt. on medications (-) Past MI and (-) CHF (-) dysrhythmias (-) pacemaker(-) Valvular Problems/Murmurs     Neuro/Psych neg Seizures Anxiety Depression    GI/Hepatic Neg liver ROS, GERD  Medicated,  Endo/Other  diabetes, Type 2, Oral Hypoglycemic Agents  Renal/GU negative Renal ROS     Musculoskeletal   Abdominal   Peds  Hematology  (+) anemia ,   Anesthesia Other Findings   Reproductive/Obstetrics                             Anesthesia Physical Anesthesia Plan  ASA: III  Anesthesia Plan: General   Post-op Pain Management:    Induction: Intravenous  PONV Risk Score and Plan: 3 and Propofol infusion, TIVA and Midazolam  Airway Management Planned: Nasal Cannula  Additional Equipment:   Intra-op Plan:   Post-operative Plan:   Informed Consent: I have reviewed the patients History and Physical, chart, labs and discussed the procedure including the risks, benefits and alternatives for the proposed anesthesia with the patient or authorized representative who has indicated his/her understanding and acceptance.     Plan Discussed with:   Anesthesia Plan Comments:         Anesthesia Quick Evaluation

## 2018-01-25 NOTE — Op Note (Signed)
[facility] Patient Name: Monica Rice Procedure Date: 01/25/2018 2:27 PM MRN: [id number] Account #: [id number] Date of Birth: 1950/11/07 Admit Type: Outpatient Age: 67 Room: [facility] ENDO ROOM 3 Gender: Female Note Status: Finalized Procedure:            Colonoscopy Indications:          High risk colon cancer surveillance: Personal history                        of colonic polyps Providers:            Manya Silvas, MD Referring MD:         Tracie Harrier, MD (Referring MD) Medicines:            Propofol per Anesthesia Complications:        No immediate complications. Procedure:            Pre-Anesthesia Assessment:                       - After reviewing the risks and benefits, the patient                        was deemed in satisfactory condition to undergo the                        procedure.                       After obtaining informed consent, the colonoscope was                        passed under direct vision. Throughout the procedure,                        the patient's blood pressure, pulse, and oxygen                        saturations were monitored continuously. The                        Colonoscope was introduced through the anus and                        advanced to the the cecum, identified by appendiceal                        orifice and ileocecal valve. The colonoscopy was                        performed without difficulty. The patient tolerated the                        procedure well. The quality of the bowel preparation                        was good. Findings:      A medium polyp was found in the hepatic flexure. The polyp was sessile.       The polyp was removed with a hot snare. Resection and retrieval were       complete. To prevent bleeding after the polypectomy, one hemostatic clip  was successfully placed. There was no bleeding during, or at the end, of       the procedure.      A small polyp  was found in the transverse colon. The polyp was sessile.       The polyp was removed with a hot snare. Resection and retrieval were       complete.      The exam was otherwise without abnormality. Impression:           - One medium polyp at the hepatic flexure, removed with                        a hot snare. Resected and retrieved. Clip was placed.                       - One small polyp in the transverse colon, removed with                        a hot snare. Resected and retrieved.                       - The examination was otherwise normal. Recommendation:       - Await pathology results. Manya Silvas, MD 01/25/2018 3:20:11 PM This report has been signed electronically. Number of Addenda: 0 Note Initiated On: 01/25/2018 2:27 PM Scope Withdrawal Time: 0 hours 30 minutes 47 seconds  Total Procedure Duration: 0 hours 38 minutes 19 seconds       [facility]

## 2018-01-25 NOTE — Transfer of Care (Signed)
Immediate Anesthesia Transfer of Care Note  Patient: Monica Rice  Procedure(s) Performed: COLONOSCOPY WITH PROPOFOL (N/A )  Patient Location: PACU  Anesthesia Type:General  Level of Consciousness: awake and sedated  Airway & Oxygen Therapy: Patient Spontanous Breathing and Patient connected to nasal cannula oxygen  Post-op Assessment: Report given to RN and Post -op Vital signs reviewed and stable  Post vital signs: Reviewed and stable  Last Vitals:  Vitals Value Taken Time  BP    Temp    Pulse    Resp    SpO2      Last Pain:  Vitals:   01/25/18 1346  TempSrc: Tympanic      Patients Stated Pain Goal: 0 (95/74/73 [id number])  Complications: No apparent anesthesia complications

## 2018-01-26 ENCOUNTER — Encounter: Payer: Self-pay | Admitting: Unknown Physician Specialty

## 2018-01-27 ENCOUNTER — Ambulatory Visit
Admission: RE | Admit: 2018-01-27 | Discharge: 2018-01-27 | Disposition: A | Discharge status: Home / Self Care | Payer: Medicare Other | Source: Ambulatory Visit | Attending: Internal Medicine | Admitting: Internal Medicine

## 2018-01-27 DIAGNOSIS — R945 Abnormal results of liver function studies: Secondary | ICD-10-CM | POA: Diagnosis present

## 2018-01-27 DIAGNOSIS — Z9049 Acquired absence of other specified parts of digestive tract: Secondary | ICD-10-CM | POA: Insufficient documentation

## 2018-01-27 DIAGNOSIS — K760 Fatty (change of) liver, not elsewhere classified: Secondary | ICD-10-CM | POA: Insufficient documentation

## 2018-01-27 DIAGNOSIS — R7989 Other specified abnormal findings of blood chemistry: Secondary | ICD-10-CM

## 2018-01-27 LAB — SURGICAL PATHOLOGY

## 2018-04-19 ENCOUNTER — Other Ambulatory Visit: Payer: Self-pay | Admitting: Internal Medicine

## 2018-04-19 DIAGNOSIS — Z1231 Encounter for screening mammogram for malignant neoplasm of breast: Secondary | ICD-10-CM

## 2018-04-28 DIAGNOSIS — I700 Atherosclerosis of aorta: Secondary | ICD-10-CM | POA: Insufficient documentation

## 2018-05-27 ENCOUNTER — Ambulatory Visit
Admission: RE | Admit: 2018-05-27 | Discharge: 2018-05-27 | Disposition: A | Discharge status: Home / Self Care | Payer: Medicare Other | Source: Ambulatory Visit | Attending: Internal Medicine | Admitting: Internal Medicine

## 2018-05-27 DIAGNOSIS — Z1231 Encounter for screening mammogram for malignant neoplasm of breast: Secondary | ICD-10-CM

## 2018-06-04 ENCOUNTER — Emergency Department: Payer: Medicare Other

## 2018-06-04 ENCOUNTER — Emergency Department
Admission: EM | Admit: 2018-06-04 | Discharge: 2018-06-04 | Disposition: A | Discharge status: Home / Self Care | Payer: Medicare Other | Attending: Emergency Medicine | Admitting: Emergency Medicine

## 2018-06-04 ENCOUNTER — Encounter: Payer: Self-pay | Admitting: Emergency Medicine

## 2018-06-04 ENCOUNTER — Other Ambulatory Visit: Payer: Self-pay

## 2018-06-04 DIAGNOSIS — J189 Pneumonia, unspecified organism: Secondary | ICD-10-CM

## 2018-06-04 DIAGNOSIS — I10 Essential (primary) hypertension: Secondary | ICD-10-CM | POA: Diagnosis not present

## 2018-06-04 DIAGNOSIS — J181 Lobar pneumonia, unspecified organism: Secondary | ICD-10-CM | POA: Diagnosis not present

## 2018-06-04 DIAGNOSIS — E785 Hyperlipidemia, unspecified: Secondary | ICD-10-CM | POA: Insufficient documentation

## 2018-06-04 DIAGNOSIS — Z79899 Other long term (current) drug therapy: Secondary | ICD-10-CM | POA: Insufficient documentation

## 2018-06-04 DIAGNOSIS — E119 Type 2 diabetes mellitus without complications: Secondary | ICD-10-CM | POA: Diagnosis not present

## 2018-06-04 DIAGNOSIS — R110 Nausea: Secondary | ICD-10-CM | POA: Diagnosis present

## 2018-06-04 MED ORDER — BENZONATATE 200 MG PO CAPS: 200.0000 mg | ORAL_CAPSULE | Freq: Three times a day (TID) | ORAL | 0 refills | Status: DC | PRN

## 2018-06-04 MED ORDER — LEVOFLOXACIN 500 MG PO TABS: 500.0000 mg | ORAL_TABLET | Freq: Every day | ORAL | 0 refills | Status: AC

## 2018-06-04 NOTE — ED Notes (Addendum)
Pt denies SOB at this time. Pt with no c/o of pain to include chest pain and no diaphoresis noted. Pt dx in ED with pneumonia. Sat at discharge 91% on room air.

## 2018-06-04 NOTE — Discharge Instructions (Signed)
Call make an appointment with your primary care provider to be reevaluated in approximately 10 to 14 days.  Return to the emergency department if any severe worsening of your symptoms.  Begin taking medication as directed.  Levaquin is 1 tablet once a day for the next 7 days.  Tessalon Perles are 1 every 8 hours if needed for coughing.  You may also take Tylenol if needed for fever, headache or body aches.

## 2018-06-04 NOTE — ED Provider Notes (Signed)
[facility] Emergency Department Provider Note  ____________________________________________   First MD Initiated Contact with Patient 06/04/18 1111     (approximate)  I have reviewed the triage vital signs and the nursing notes.   HISTORY  Chief Complaint Nausea   HPI Monica Rice is a 68 y.o. female  to the ED with complaint of nausea for the last several days.  Patient denies any vomiting.  She states she has had cough and congestion for several weeks.  She was seen by her PCP last week and placed on tramadol  which has not helped.  She states the tramadol was for pain secondary to her coughing.  She is unaware of any fever and denies chills.  She denies any pain at this time and rates it is a 0/10.  Past Medical History:  Diagnosis Date  . Anemia   . Anxiety   . Cancer ([place])    skin ca  . Cervicalgia   . Complication of anesthesia    "difficult to wake up"  . DDD (degenerative disc disease), lumbar   . DDD (degenerative disc disease), lumbar   . Depression   . Diabetes mellitus without complication ([place])   . Difficult intubation   . Fatty liver   . GERD (gastroesophageal reflux disease)   . Glaucoma   . H/O scarlet fever   . Hyperlipidemia   . Hypersomnia with sleep apnea   . Hypertension   . Peripheral vascular disease ([place])   . Seasonal allergies   . Sleep apnea     Patient Active Problem List   Diagnosis Date Noted  . 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 Depression 01/30/2016  . Diabetes mellitus type 2, uncomplicated ([place]) 19/37/9024  . Mixed hyperlipidemia 01/30/2016  . Sleep apnea 01/30/2016  . Status post total replacement of right hip 06/26/2015  . Chronic midline low back pain with bilateral sciatica 11/02/2014  . Bilateral carotid artery stenosis 10/04/2014  . Benign essential hypertension 09/21/2014  . DDD (degenerative disc disease), lumbar 12/28/2013  . Lumbar radiculitis 12/09/2013    Past  Surgical History:  Procedure Laterality Date  . APPENDECTOMY    . CARPAL TUNNEL RELEASE Bilateral   . CATARACT EXTRACTION, BILATERAL    . CESAREAN SECTION     x2   . CHOLECYSTECTOMY    . COLONOSCOPY    . COLONOSCOPY WITH PROPOFOL N/A 01/25/2018   Procedure: COLONOSCOPY WITH PROPOFOL;  Surgeon: Manya Silvas, MD;  Location[facility]COPY;  Service: Endoscopy;  Laterality: N/A;  . ESOPHAGOGASTRODUODENOSCOPY    . HERNIA REPAIR    . JOINT REPLACEMENT Bilateral   . LAPAROSCOPY N/A 01/25/2016   Procedure: LAPAROSCOPY DIAGNOSTIC;  Surgeon: Jules Husbands, MD;  Location: [facility] ORS;  Service: General;  Laterality: N/A;  . LAPAROTOMY N/A 01/25/2016   Procedure: EXPLORATORY LAPAROTOMY;  Surgeon: Jules Husbands, MD;  Location: [facility] ORS;  Service: General;  Laterality: N/A;  . TOTAL HIP ARTHROPLASTY Left 03/11/2017   Procedure: TOTAL HIP ARTHROPLASTY;  Surgeon: Dereck Leep, MD;  Location: [facility] ORS;  Service: Orthopedics;  Laterality: Left;  . tumor rt foot Right   . varicose      Prior to Admission medications   Medication Sig Start Date End Date Taking? Authorizing Provider  ACCU-CHEK AVIVA PLUS test strip  12/20/15   [provider]  amLODipine (NORVASC) 5 MG tablet Take 5 mg by mouth daily.  01/17/16   [provider]  aspirin 81  MG EC tablet Take 81 mg by mouth daily. Swallow whole.    [provider]  atorvastatin (LIPITOR) 40 MG tablet Take 40 mg by mouth at bedtime. 02/05/17   [provider]  benzonatate (TESSALON) 200 MG capsule Take 1 capsule (200 mg total) by mouth 3 (three) times daily as needed for cough. 06/04/18 06/04/19  Johnn Hai, PA-C  Biotin w/ Vitamins C & E (HAIR/SKIN/NAILS PO) Take 1 tablet by mouth 2 (two) times daily.    [provider]  buPROPion (WELLBUTRIN XL) 150 MG 24 hr tablet Take 150 mg by mouth at bedtime.  02/11/17   [provider]  Calcium Carb-Cholecalciferol (CALCIUM 600 + D PO) Take 1 capsule by mouth  daily. Vitamin d3 800iu    [provider]  calcium-vitamin D (OSCAL WITH D) 500-200 MG-UNIT tablet Take 1 tablet by mouth daily.    [provider]  Cinnamon 500 MG capsule Take 1,000 mg by mouth 2 (two) times daily.     [provider]  clonazePAM (KLONOPIN) 0.5 MG tablet Take 0.5 mg by mouth daily as needed for anxiety.  01/26/15   [provider]  colestipol (COLESTID) 1 g tablet Take 1 g by mouth daily.    [provider]  Dulaglutide 0.75 MG/0.5ML SOPN Inject into the skin once a week.    [provider]  enoxaparin (LOVENOX) 40 MG/0.4ML injection Inject 0.4 mLs (40 mg total) into the skin daily. Patient not taking: Reported on 01/22/2018 03/12/17   Watt Climes, PA  ferrous sulfate 325 (65 FE) MG tablet TAKE ONE TABLET BY MOUTH EVERY MORNING WITH BREAKFAST 12/28/15   [provider]  FLUoxetine (PROZAC) 20 MG capsule Take 40 mg by mouth daily.  01/03/16   [provider]  fluticasone (FLONASE) 50 MCG/ACT nasal spray Place 2 sprays into both nostrils daily as needed for allergies. 01/30/17   [provider]  gabapentin (NEURONTIN) 100 MG capsule Take 100 mg by mouth at bedtime. 02/11/17   [provider]  glucose blood test strip Use 3 (three) times daily. Use as instructed. 02/07/16   [provider]  hydrochlorothiazide (HYDRODIURIL) 25 MG tablet Take 25 mg by mouth daily.  11/23/15   [provider]  insulin NPH-regular Human (NOVOLIN 70/30) (70-30) 100 UNIT/ML injection Inject 20-60 Units into the skin 2 (two) times daily before a meal. Take 20 units in the morning and 60 units in the evening.    [provider]  latanoprost (XALATAN) 0.005 % ophthalmic solution Place 1 drop into both eyes at bedtime.  01/19/16   [provider]  levofloxacin (LEVAQUIN) 500 MG tablet Take 1 tablet (500 mg total) by mouth daily for 7 days. 06/04/18 06/11/18  Johnn Hai, PA-C  Magnesium 250  MG TABS Take by mouth.    [provider]  meloxicam (MOBIC) 7.5 MG tablet Take 7.5 mg by mouth 2 (two) times daily as needed for pain.  08/15/15   [provider]  metFORMIN (GLUCOPHAGE) 1000 MG tablet Take 1,000 mg by mouth 2 (two) times daily with a meal.  01/03/16   [provider]  Multiple Vitamins-Minerals (HAIR/SKIN/NAILS) TABS Take 1 tablet by mouth 2 (two) times daily.    [provider]  Multiple Vitamins-Minerals (MULTIVITAMIN ADULT PO) Take 1 tablet by mouth daily.    [provider]  omeprazole (PRILOSEC) 20 MG capsule Take 20 mg by mouth daily.  08/15/15   [provider]  oxyCODONE (OXY IR/ROXICODONE) 5 MG immediate release tablet Take 1-2 tablets (5-10 mg total) by mouth every 4 (four) hours as needed for severe pain. Patient not taking: Reported on 01/22/2018 03/12/17   Watt Climes, PA  traMADol (ULTRAM) 50 MG tablet Take 1-2 tablets (50-100 mg total) by mouth every 4 (four) hours as needed for moderate pain. 03/12/17   Watt Climes, PA  vitamin B-12 (CYANOCOBALAMIN) 1000 MCG tablet Take 1,000 mcg by mouth daily. 12/28/15   [provider]    Allergies Hydrocodone; Neomycin-bacitracin-polymyxin [bacitracin-neomycin-polymyxin]; and Sulfa antibiotics  Family History  Problem Relation Age of Onset  . COPD Mother   . Hyperlipidemia Mother   . Heart disease Mother   . Kidney disease Father   . Heart disease Father   . Hypertension Sister   . Skin cancer Sister   . Breast cancer Neg Hx     Social History Social History   Tobacco Use  . Smoking status: Never Smoker  . Smokeless tobacco: Never Used  Substance Use Topics  . Alcohol use: No  . Drug use: No    Review of Systems Constitutional: No fever/chills ENT: No sore throat.  Positive nasal congestion. Cardiovascular: Denies chest pain. Respiratory: Denies shortness of breath.  Positive for cough, rarely productive. Gastrointestinal: No abdominal pain.   Positive nausea, no vomiting.  No diarrhea. Genitourinary: Negative for dysuria. Musculoskeletal: Negative for body aches. Skin: Negative for rash. Neurological: Negative for headaches, focal weakness or numbness. ____________________________________________   PHYSICAL EXAM:  VITAL SIGNS: ED Triage Vitals  Enc Vitals Group     BP 06/04/18 1019 121/77     Pulse Rate 06/04/18 1019 97     Resp --      Temp 06/04/18 1019 98.3 F (36.8 C)     Temp Source 06/04/18 1019 Oral     SpO2 06/04/18 1019 94 %     Weight 06/04/18 1019 178 lb (80.7 kg)     Height 06/04/18 1019 6' (1.829 m)     Head Circumference --      Peak Flow --      Pain Score 06/04/18 1033 0     Pain Loc --      Pain Edu? --      Excl. in [place]? --    Constitutional: Alert and oriented. Well appearing and in no acute distress. Eyes: Conjunctivae are normal.  Head: Atraumatic. Nose: Mild congestion/rhinnorhea. Mouth/Throat: Mucous membranes are moist.  Oropharynx non-erythematous. Neck: No stridor.   Hematological/Lymphatic/Immunilogical: No cervical lymphadenopathy. Cardiovascular: Normal rate, regular rhythm. Grossly normal heart sounds.  Good peripheral circulation. Respiratory: Normal respiratory effort.  No retractions. Lungs no rales or rhonchi were noted.  Patient at this time has a nonproductive cough.  She is resting comfortably and using her cellphone.  Patient does not appear to be any acute distress and is able to talk in complete sentences without any shortness of breath or difficulties speaking. Musculoskeletal: No lower extremity tenderness nor edema.  No joint effusions. Neurologic:  Normal speech and language. No gross focal neurologic deficits are appreciated. No gait instability. Skin:  Skin is warm, dry and intact. No rash noted. Psychiatric: Mood and affect are normal. Speech and behavior are normal.  ____________________________________________   LABS (all labs ordered are listed, but only  abnormal results are displayed)  Labs Reviewed - No data to display  RADIOLOGY  Official radiology report(s): Dg Chest 2 View  Result Date: 06/04/2018 CLINICAL DATA:  68 year old female with nausea  for several days. Congestion for several weeks. EXAM: CHEST - 2 VIEW COMPARISON:  Chest radiographs 03/31/2014. FINDINGS: Abnormal lower lobe opacity on the lateral view is not well correlated on the PA but probably on the left. Small pleural effusion is possible. The right lung appears clear. Normal cardiac size and mediastinal contours. Visualized tracheal air column is within normal limits. No pneumothorax. Visible bowel-gas pattern within normal limits. No acute osseous abnormality identified. IMPRESSION: Left lower lobe opacity suspicious for pneumonia. Possible small left pleural effusion. If there are signs/symptoms of infection then a followup PA and lateral chest X-ray is recommended in 3-4 weeks following trial of antibiotic therapy to ensure resolution and exclude underlying malignancy. Otherwise Chest CT (IV contrast preferred) recommended to evaluate further. Electronically Signed   By: Genevie Ann M.D.   On: 06/04/2018 11:08   ___________________________________________   PROCEDURES  Procedure(s) performed: None  Procedures  Critical Care performed: No  ____________________________________________   INITIAL IMPRESSION / ASSESSMENT AND PLAN / ED COURSE  As part of my medical decision making, I reviewed the following data within the electronic MEDICAL RECORD NUMBER Notes from prior ED visits and [place] Controlled Substance Database  Patient presents to the ED from home with complaint of 2 weeks plus of cough and congestion.  She states that for the last 2 days she has had some nausea but no vomiting.  She continues to have congestion.  She has been seen by her PCP at which time she was placed on tramadol.  She is unsure today if the tramadol is what is causing the nausea.  She denies any  previous asthma but reports bronchitis.  Patient states at the time of discharge that she went to [facility] acute care and was told to come to the ED.  We discussed x-ray findings of pneumonia and left lower lobe.  Patient was placed on Levaquin 500 mg daily for the next 7 days along with Tessalon as needed for cough.  She is aware that she will need to follow-up with her PCP for reevaluation and a follow-up chest x-ray in the near future.  Patient is aware that she will need to return to the ED if any severe worsening of her symptoms such as shortness of breath, vomiting or inability to take the antibiotic.  ____________________________________________   FINAL CLINICAL IMPRESSION(S) / ED DIAGNOSES  Final diagnoses:  Pneumonia of left lower lobe due to infectious organism [facility])     ED Discharge Orders         Ordered    levofloxacin (LEVAQUIN) 500 MG tablet  Daily     06/04/18 1133    benzonatate (TESSALON) 200 MG capsule  3 times daily PRN     06/04/18 1133           Note:  This document was prepared using Dragon voice recognition software and may include unintentional dictation errors.    Johnn Hai, PA-C 06/04/18 1349    Nena Polio, MD 06/04/18 1524

## 2018-06-04 NOTE — ED Notes (Signed)
First Nurse Note: Patient to ED via [place] from [facility] complaining of N&V with diaphoresis, went to [facility] thinking she was having allergic to Tramadol.  Oxygen sat 88 and patient was placed on OX via [place] by [facility].  Alert and oriented X 4.

## 2018-06-04 NOTE — ED Triage Notes (Addendum)
Pt to ED from home c/o nausea for a couple days no vomiting.  Also c/o congestion for a couple weeks.  States been taking tramadol since last Saturday after fall and concerned nausea from medication.

## 2018-06-04 NOTE — ED Notes (Signed)
Pt verbalized understanding of discharge instructions. NAD at this time. 

## 2018-11-17 ENCOUNTER — Other Ambulatory Visit: Payer: Self-pay | Admitting: Otolaryngology

## 2018-11-17 DIAGNOSIS — H903 Sensorineural hearing loss, bilateral: Secondary | ICD-10-CM

## 2018-11-22 DIAGNOSIS — M654 Radial styloid tenosynovitis [de Quervain]: Secondary | ICD-10-CM | POA: Insufficient documentation

## 2018-11-22 DIAGNOSIS — M67432 Ganglion, left wrist: Secondary | ICD-10-CM | POA: Insufficient documentation

## 2018-12-08 ENCOUNTER — Ambulatory Visit
Admission: RE | Admit: 2018-12-08 | Discharge: 2018-12-08 | Disposition: A | Discharge status: Home / Self Care | Payer: Medicare Other | Source: Ambulatory Visit | Attending: Otolaryngology | Admitting: Otolaryngology

## 2018-12-08 ENCOUNTER — Other Ambulatory Visit: Payer: Self-pay

## 2018-12-08 DIAGNOSIS — H903 Sensorineural hearing loss, bilateral: Secondary | ICD-10-CM | POA: Diagnosis present

## 2018-12-08 LAB — POCT I-STAT CREATININE: Creatinine, Ser: 0.7 mg/dL (ref 0.44–1.00)

## 2018-12-08 MED ORDER — GADOBUTROL 1 MMOL/ML IV SOLN
8.0000 mL | Freq: Once | INTRAVENOUS | Status: AC | PRN
  Administered 2018-12-08: 8 mL via INTRAVENOUS

## 2019-01-10 ENCOUNTER — Encounter
Admission: RE | Admit: 2019-01-10 | Discharge: 2019-01-10 | Disposition: A | Discharge status: Home / Self Care | Payer: Medicare Other | Source: Ambulatory Visit | Attending: Orthopedic Surgery | Admitting: Orthopedic Surgery

## 2019-01-10 ENCOUNTER — Other Ambulatory Visit: Payer: Self-pay

## 2019-01-10 DIAGNOSIS — Z01818 Encounter for other preprocedural examination: Secondary | ICD-10-CM | POA: Insufficient documentation

## 2019-01-10 DIAGNOSIS — Z20828 Contact with and (suspected) exposure to other viral communicable diseases: Secondary | ICD-10-CM | POA: Insufficient documentation

## 2019-01-10 LAB — BASIC METABOLIC PANEL
Anion gap: 9 (ref 5–15)
BUN: 18 mg/dL (ref 8–23)
CO2: 30 mmol/L (ref 22–32)
Calcium: 9.5 mg/dL (ref 8.9–10.3)
Chloride: 102 mmol/L (ref 98–111)
Creatinine, Ser: 0.6 mg/dL (ref 0.44–1.00)
GFR calc Af Amer: >60 mL/min (ref >60)
GFR calc non Af Amer: >60 mL/min (ref >60)
Glucose, Bld: 183 mg/dL — ABNORMAL HIGH (ref 70–99)
Potassium: 4.2 mmol/L (ref 3.5–5.1)
Sodium: 141 mmol/L (ref 135–145)

## 2019-01-10 LAB — CBC
HCT: 40.3 % (ref 36.0–46.0)
Hemoglobin: 13.4 g/dL (ref 12.0–15.0)
MCH: 29.8 pg (ref 26.0–34.0)
MCHC: 33.3 g/dL (ref 30.0–36.0)
MCV: 89.6 fL (ref 80.0–100.0)
Platelets: 168 10*3/uL (ref 150–400)
RBC: 4.5 MIL/uL (ref 3.87–5.11)
RDW: 12.6 % (ref 11.5–15.5)
WBC: 6.1 10*3/uL (ref 4.0–10.5)
nRBC: 0 % (ref 0.0–0.2)

## 2019-01-10 NOTE — Pre-Procedure Instructions (Signed)
   ECG 12-lead1/08/2018 [place] Component Name Value Ref Range  Vent Rate (bpm) 94   PR Interval (msec) 166   QRS Interval (msec) 86   QT Interval (msec) 360   QTc (msec) 450   Other Result Information  This result has an attachment that is not available.  Result Narrative  Normal sinus rhythm Cannot rule out Anterior infarct (cited on or before 04-Oct-2015) Abnormal ECG When compared with ECG of 21-Oct-2017 09:33, No significant change was found I reviewed and concur with this report. Electronically signed LK:[id number], MD, Edd Arbour [phone]) on 06/22/2018 3:11:30 PM

## 2019-01-10 NOTE — Patient Instructions (Signed)
Your procedure is scheduled on: Monday 01/17/19 Report to [place]. To find out your arrival time please call [phone] between 1PM - 3PM on Friday 01/14/19.  Remember: Instructions that are not followed completely may result in serious medical risk, up to and including death, or upon the discretion of your surgeon and anesthesiologist your surgery may need to be rescheduled.     _X__ 1. Do not eat food after midnight the night before your procedure.                 No gum chewing or hard candies. You may drink clear liquids up to 2 hours                 before you are scheduled to arrive for your surgery- DO not drink clear                 liquids within 2 hours of the start of your surgery.                 Clear Liquids include:  water, apple juice without pulp, clear carbohydrate                 drink such as Clearfast or Gatorade, Black Coffee or Tea (Do not add                 anything to coffee or tea). Diabetics water only  __X__2.  On the morning of surgery brush your teeth with toothpaste and water, you                 may rinse your mouth with mouthwash if you wish.  Do not swallow any              toothpaste of mouthwash.     _X__ 3.  No Alcohol for 24 hours before or after surgery.   _X__ 4.  Do Not Smoke or use e-cigarettes For 24 Hours Prior to Your Surgery.                 Do not use any chewable tobacco products for at least 6 hours prior to                 surgery.  ____  5.  Bring all medications with you on the day of surgery if instructed.   __X__  6.  Notify your doctor if there is any change in your medical condition      (cold, fever, infections).     Do not wear jewelry, make-up, hairpins, clips or nail polish. Do not wear lotions, powders, or perfumes.  Do not shave 48 hours prior to surgery. Men may shave face and neck. Do not bring valuables to the hospital.    [facility] is not responsible for any  belongings or valuables.  Contacts, dentures/partials or body piercings may not be worn into surgery. Bring a case for your contacts, glasses or hearing aids, a denture cup will be supplied. Leave your suitcase in the car. After surgery it may be brought to your room. For patients admitted to the hospital, discharge time is determined by your treatment team.   Patients discharged the day of surgery will not be allowed to drive home.   Please read over the following fact sheets that you were given:   MRSA Information  __X__ Take these medicines the morning of surgery with A SIP OF WATER:  1. amLODipine (NORVASC  2. FLUoxetine (PROZAC  3. omeprazole (PRILOSEC  4.  5.  6.  ____ Fleet Enema (as directed)   __X__ Use CHG Soap/SAGE wipes as directed  ____ Use inhalers on the day of surgery  __X__ Stop metformin/Janumet/Farxiga 2 days prior to surgery    __X__ Take 1/2 of usual insulin dose the night before surgery. No insulin the morning          of surgery.   ____ Stop Blood Thinners Coumadin/Plavix/Xarelto/Pleta/Pradaxa/Eliquis/Effient/Aspirin  on   Or contact your Surgeon, Cardiologist or Medical Doctor regarding  ability to stop your blood thinners  __X__ Stop Anti-inflammatories 7 days before surgery such as Advil, Ibuprofen, Motrin,  BC or Goodies Powder, Naprosyn, Naproxen, Aleve, Aspirin    __X__ Stop all herbal supplements, fish oil or vitamin E until after surgery.    ____ Bring C-Pap to the hospital.

## 2019-01-13 ENCOUNTER — Other Ambulatory Visit: Payer: Self-pay

## 2019-01-13 ENCOUNTER — Other Ambulatory Visit
Admission: RE | Admit: 2019-01-13 | Discharge: 2019-01-13 | Disposition: A | Discharge status: Home / Self Care | Payer: Medicare Other | Source: Ambulatory Visit | Attending: Orthopedic Surgery | Admitting: Orthopedic Surgery

## 2019-01-13 DIAGNOSIS — Z01818 Encounter for other preprocedural examination: Secondary | ICD-10-CM | POA: Diagnosis not present

## 2019-01-13 LAB — SARS CORONAVIRUS 2 (TAT 6-24 HRS): SARS Coronavirus 2: NEGATIVE

## 2019-01-16 MED ORDER — CEFAZOLIN SODIUM-DEXTROSE 2-4 GM/100ML-% IV SOLN
2.0000 g | INTRAVENOUS | Status: AC
  Administered 2019-01-17: 2 g via INTRAVENOUS

## 2019-01-17 ENCOUNTER — Ambulatory Visit: Payer: Medicare Other | Admitting: Certified Registered Nurse Anesthetist

## 2019-01-17 ENCOUNTER — Encounter: Payer: Self-pay | Admitting: Orthopedic Surgery

## 2019-01-17 ENCOUNTER — Other Ambulatory Visit: Payer: Self-pay

## 2019-01-17 ENCOUNTER — Encounter
Admission: RE | Disposition: A | Discharge status: Home / Self Care | Payer: Self-pay | Source: Ambulatory Visit | Attending: Orthopedic Surgery

## 2019-01-17 ENCOUNTER — Ambulatory Visit
Admission: RE | Admit: 2019-01-17 | Discharge: 2019-01-17 | Disposition: A | Discharge status: Home / Self Care | Payer: Medicare Other | Source: Ambulatory Visit | Attending: Orthopedic Surgery | Admitting: Orthopedic Surgery

## 2019-01-17 DIAGNOSIS — M654 Radial styloid tenosynovitis [de Quervain]: Secondary | ICD-10-CM | POA: Diagnosis not present

## 2019-01-17 DIAGNOSIS — E669 Obesity, unspecified: Secondary | ICD-10-CM | POA: Diagnosis not present

## 2019-01-17 DIAGNOSIS — Z6829 Body mass index (BMI) 29.0-29.9, adult: Secondary | ICD-10-CM | POA: Diagnosis not present

## 2019-01-17 DIAGNOSIS — I10 Essential (primary) hypertension: Secondary | ICD-10-CM | POA: Insufficient documentation

## 2019-01-17 DIAGNOSIS — J302 Other seasonal allergic rhinitis: Secondary | ICD-10-CM | POA: Insufficient documentation

## 2019-01-17 DIAGNOSIS — Z79899 Other long term (current) drug therapy: Secondary | ICD-10-CM | POA: Insufficient documentation

## 2019-01-17 DIAGNOSIS — G4710 Hypersomnia, unspecified: Secondary | ICD-10-CM | POA: Insufficient documentation

## 2019-01-17 DIAGNOSIS — D649 Anemia, unspecified: Secondary | ICD-10-CM | POA: Diagnosis not present

## 2019-01-17 DIAGNOSIS — E1136 Type 2 diabetes mellitus with diabetic cataract: Secondary | ICD-10-CM | POA: Diagnosis not present

## 2019-01-17 DIAGNOSIS — E785 Hyperlipidemia, unspecified: Secondary | ICD-10-CM | POA: Diagnosis not present

## 2019-01-17 DIAGNOSIS — M67432 Ganglion, left wrist: Secondary | ICD-10-CM | POA: Diagnosis not present

## 2019-01-17 DIAGNOSIS — Z7982 Long term (current) use of aspirin: Secondary | ICD-10-CM | POA: Diagnosis not present

## 2019-01-17 DIAGNOSIS — G44229 Chronic tension-type headache, not intractable: Secondary | ICD-10-CM | POA: Diagnosis not present

## 2019-01-17 DIAGNOSIS — G4730 Sleep apnea, unspecified: Secondary | ICD-10-CM | POA: Diagnosis not present

## 2019-01-17 DIAGNOSIS — M1990 Unspecified osteoarthritis, unspecified site: Secondary | ICD-10-CM | POA: Diagnosis not present

## 2019-01-17 DIAGNOSIS — Z96643 Presence of artificial hip joint, bilateral: Secondary | ICD-10-CM | POA: Diagnosis not present

## 2019-01-17 DIAGNOSIS — Z794 Long term (current) use of insulin: Secondary | ICD-10-CM | POA: Diagnosis not present

## 2019-01-17 DIAGNOSIS — F418 Other specified anxiety disorders: Secondary | ICD-10-CM | POA: Insufficient documentation

## 2019-01-17 DIAGNOSIS — M5136 Other intervertebral disc degeneration, lumbar region: Secondary | ICD-10-CM | POA: Diagnosis not present

## 2019-01-17 HISTORY — PX: DORSAL COMPARTMENT RELEASE: SHX5039

## 2019-01-17 HISTORY — PX: GANGLION CYST EXCISION: SHX1691

## 2019-01-17 LAB — GLUCOSE, CAPILLARY
Glucose-Capillary: 172 mg/dL — ABNORMAL HIGH (ref 70–99)
Glucose-Capillary: 139 mg/dL — ABNORMAL HIGH (ref 70–99)

## 2019-01-17 SURGERY — RELEASE, FIRST DORSAL COMPARTMENT, HAND
Anesthesia: General | Site: Wrist | Laterality: Left

## 2019-01-17 MED ORDER — PHENYLEPHRINE HCL (PRESSORS) 10 MG/ML IV SOLN
INTRAVENOUS | Status: DC | PRN
  Administered 2019-01-17 (×2): 100 ug via INTRAVENOUS

## 2019-01-17 MED ORDER — BUPIVACAINE HCL (PF) 0.25 % IJ SOLN
INTRAMUSCULAR | Status: DC | PRN
  Administered 2019-01-17: 5 mL

## 2019-01-17 MED ORDER — FENTANYL CITRATE (PF) 100 MCG/2ML IJ SOLN
INTRAMUSCULAR | Status: AC
  Filled 2019-01-17: qty 2

## 2019-01-17 MED ORDER — CHLORHEXIDINE GLUCONATE 4 % EX LIQD: 60.0000 mL | Freq: Once | CUTANEOUS | Status: DC

## 2019-01-17 MED ORDER — ACETAMINOPHEN 10 MG/ML IV SOLN
INTRAVENOUS | Status: DC | PRN
  Administered 2019-01-17: 1000 mg via INTRAVENOUS

## 2019-01-17 MED ORDER — ENSURE PRE-SURGERY PO LIQD
296.0000 mL | Freq: Once | ORAL | Status: DC
  Filled 2019-01-17: qty 296

## 2019-01-17 MED ORDER — ONDANSETRON HCL 4 MG/2ML IJ SOLN
INTRAMUSCULAR | Status: DC | PRN
  Administered 2019-01-17: 4 mg via INTRAVENOUS

## 2019-01-17 MED ORDER — PROPOFOL 10 MG/ML IV BOLUS
INTRAVENOUS | Status: DC | PRN
  Administered 2019-01-17: 150 mg via INTRAVENOUS

## 2019-01-17 MED ORDER — BUPIVACAINE HCL (PF) 0.25 % IJ SOLN
INTRAMUSCULAR | Status: AC
  Filled 2019-01-17: qty 30

## 2019-01-17 MED ORDER — FENTANYL CITRATE (PF) 100 MCG/2ML IJ SOLN
INTRAMUSCULAR | Status: DC | PRN
  Administered 2019-01-17 (×2): 25 ug via INTRAVENOUS
  Administered 2019-01-17: 50 ug via INTRAVENOUS

## 2019-01-17 MED ORDER — ACETAMINOPHEN 10 MG/ML IV SOLN
INTRAVENOUS | Status: AC
  Filled 2019-01-17: qty 100

## 2019-01-17 MED ORDER — TRAMADOL HCL 50 MG PO TABS: 50.0000 mg | ORAL_TABLET | Freq: Four times a day (QID) | ORAL | 0 refills | Status: DC | PRN

## 2019-01-17 MED ORDER — DEXAMETHASONE SODIUM PHOSPHATE 10 MG/ML IJ SOLN
INTRAMUSCULAR | Status: DC | PRN
  Administered 2019-01-17: 10 mg via INTRAVENOUS

## 2019-01-17 MED ORDER — ONDANSETRON HCL 4 MG/2ML IJ SOLN: 4.0000 mg | Freq: Once | INTRAMUSCULAR | Status: DC | PRN

## 2019-01-17 MED ORDER — FENTANYL CITRATE (PF) 100 MCG/2ML IJ SOLN: 25.0000 ug | INTRAMUSCULAR | Status: DC | PRN

## 2019-01-17 MED ORDER — SODIUM CHLORIDE 0.9 % IV SOLN
INTRAVENOUS | Status: DC
  Administered 2019-01-17: 15:00:00 via INTRAVENOUS

## 2019-01-17 MED ORDER — LIDOCAINE HCL (CARDIAC) PF 100 MG/5ML IV SOSY
PREFILLED_SYRINGE | INTRAVENOUS | Status: DC | PRN
  Administered 2019-01-17: 100 mg via INTRAVENOUS

## 2019-01-17 MED ORDER — CEFAZOLIN SODIUM-DEXTROSE 2-4 GM/100ML-% IV SOLN
INTRAVENOUS | Status: AC
  Filled 2019-01-17: qty 100

## 2019-01-17 MED ORDER — TRAMADOL HCL 50 MG PO TABS
50.0000 mg | ORAL_TABLET | Freq: Once | ORAL | Status: AC
  Administered 2019-01-17: 50 mg via ORAL

## 2019-01-17 MED ORDER — TRAMADOL HCL 50 MG PO TABS
ORAL_TABLET | ORAL | Status: AC
  Administered 2019-01-17: 50 mg via ORAL
  Filled 2019-01-17: qty 1

## 2019-01-17 SURGICAL SUPPLY — 40 items
BLADE SURG 15 STRL LF DISP TIS (BLADE) ×1 IMPLANT
BLADE SURG 15 STRL SS (BLADE) ×2
BNDG CONFORM 2 STRL LF (GAUZE/BANDAGES/DRESSINGS) ×1 IMPLANT
BNDG ELASTIC 2X5.8 VLCR NS LF (GAUZE/BANDAGES/DRESSINGS) ×3 IMPLANT
BNDG ESMARK 4X12 TAN STRL LF (GAUZE/BANDAGES/DRESSINGS) ×3 IMPLANT
BNDG STRETCH 4X75 STRL LF (GAUZE/BANDAGES/DRESSINGS) ×1 IMPLANT
CANISTER SUCT 1200ML W/VALVE (MISCELLANEOUS) ×3 IMPLANT
CLOSURE WOUND 1/2 X4 (GAUZE/BANDAGES/DRESSINGS)
COVER WAND RF STERILE (DRAPES) ×1 IMPLANT
CUFF TOURN SGL QUICK 18X4 (TOURNIQUET CUFF) ×3 IMPLANT
DRSG DERMACEA 8X12 NADH (GAUZE/BANDAGES/DRESSINGS) ×3 IMPLANT
DURAPREP 26ML APPLICATOR (WOUND CARE) ×3 IMPLANT
ELECT CAUTERY NEEDLE TIP 1.0 (MISCELLANEOUS) ×3
ELECT REM PT RETURN 9FT ADLT (ELECTROSURGICAL) ×3
ELECTRODE CAUTERY NEDL TIP 1.0 (MISCELLANEOUS) ×1 IMPLANT
ELECTRODE REM PT RTRN 9FT ADLT (ELECTROSURGICAL) ×1 IMPLANT
GAUZE SPONGE 4X4 12PLY STRL (GAUZE/BANDAGES/DRESSINGS) ×3 IMPLANT
GLOVE BIO SURGEON STRL SZ8 (GLOVE) ×3 IMPLANT
GLOVE BIOGEL M STRL SZ7.5 (GLOVE) ×3 IMPLANT
GOWN STRL REUS W/ TWL LRG LVL3 (GOWN DISPOSABLE) ×2 IMPLANT
GOWN STRL REUS W/TWL LRG LVL3 (GOWN DISPOSABLE) ×4
KIT TURNOVER KIT A (KITS) ×3 IMPLANT
NDL HYPO 25X1 1.5 SAFETY (NEEDLE) ×1 IMPLANT
NEEDLE HYPO 25X1 1.5 SAFETY (NEEDLE) ×3 IMPLANT
NS IRRIG 500ML POUR BTL (IV SOLUTION) ×3 IMPLANT
PACK EXTREMITY ARMC (MISCELLANEOUS) ×3 IMPLANT
PAD CAST CTTN 4X4 STRL (SOFTGOODS) ×1 IMPLANT
PADDING CAST COTTON 4X4 STRL (SOFTGOODS)
SOL PREP PVP 2OZ (MISCELLANEOUS) ×3
SOLUTION PREP PVP 2OZ (MISCELLANEOUS) ×1 IMPLANT
SPLINT CAST 1 STEP 3X12 (MISCELLANEOUS) ×3 IMPLANT
STOCKINETTE 48X4 2 PLY STRL (GAUZE/BANDAGES/DRESSINGS) ×1 IMPLANT
STOCKINETTE BIAS CUT 4 980044 (GAUZE/BANDAGES/DRESSINGS) ×1 IMPLANT
STOCKINETTE STRL 4IN 9604848 (GAUZE/BANDAGES/DRESSINGS) ×3 IMPLANT
STRIP CLOSURE SKIN 1/2X4 (GAUZE/BANDAGES/DRESSINGS) ×1 IMPLANT
SUT ETHILON 5-0 FS-2 18 BLK (SUTURE) ×3 IMPLANT
SUT VIC AB 2-0 SH 27 (SUTURE)
SUT VIC AB 2-0 SH 27XBRD (SUTURE) ×1 IMPLANT
SUT VIC AB 3-0 PS2 18 (SUTURE) ×1 IMPLANT
SYR 10ML LL (SYRINGE) ×3 IMPLANT

## 2019-01-17 NOTE — Op Note (Signed)
OPERATIVE NOTE  DATE OF SURGERY:  01/17/2019  PATIENT NAME:  Monica Rice   DOB: 1950-07-08  MRN: [id number]  PRE-OPERATIVE DIAGNOSIS: Left DeQuervain's stenosing tenosynovitis Left volar wrist ganglion cyst  POST-OPERATIVE DIAGNOSIS:  Same  PROCEDURE:  Left DeQuervain's release Excision of ganglion cyst from the volar left wrist  SURGEON:  Marciano Sequin. M.D.  ANESTHESIA: general  ESTIMATED BLOOD LOSS: Minimal  FLUIDS REPLACED: 500 mL of crystalloid  TOURNIQUET TIME: 1 - 30 minutes  2 - 5 minutes  DRAINS: None  INDICATIONS FOR SURGERY: Monica Rice is a 68 y.o. year old female with a  history of pain over the first dorsal compartment of the left wrist.  She was also noted to have a cystic lesion to the volar surface of the left wrist consistent with a ganglion cyst.  The patient had not seen any significant improvement despite conservative nonsurgical intervention. After discussion of the risks and benefits of surgical intervention, the patient expressed understanding of the risks benefits and agree with plans for release of the first dorsal compartment of the wrist (DeQuervain's release) and excision of the ganglion cyst from the left wrist.   PROCEDURE IN DETAIL: The patient was brought into the operating room and after adequate general anesthesia, a tourniquet was placed on the patient's left upper arm.The left hand and arm were prepped with alcohol and Duraprep and draped in the usual sterile fashion. A "time-out" was performed as per usual protocol. The hand and forearm were exsanguinated using an Esmarch and the tourniquet was inflated to 250 mmHg. Loupe magnification was used throughout the procedure. An incision was made over the first dorsal compartment approximately 0.5 centimeters proximal to the tip of the radial styloid. Care was taken to identify and protect the radial sensory branches. Dissection was carried down to the annular ligament. The annular  ligament was sharply incised and there was noted to be some thickening and inflammatory changes to the tenosynovium which was subsequently debrided. The extensor pollicis brevis and abductor pollicis longus were elevated out of the wound with a hook to document complete decompression. Free and independent movement of the tendons was noticed by passively moving the thumb.   Next, a transverse incision was made directly over the cystic mass along the radial aspect of the volar left wrist.  Careful blunt dissection was carried down to the cystic mass.  The ganglion cyst was dissected free using tenotomy scissors and then incised at its base.  The specimen was submitted to pathology.  The original size of the cyst measure approximately 2 cm in diameter.  The 2 incisions were irrigated with copious amounts of normal saline with antibiotic solution.  Tourniquet was deflated after initial tourniquet time of 30 minutes.  The surgical incision over the first dorsal compartment was repaired using interrupted sutures of #5-0 nylon.  There was noted to be some persistent venous bleeding from the volar incision.  The tourniquet was reinflated to 250 mmHg so as to better visualize the source of the bleeding.  A superficial vein was identified and cauterized.  Tourniquet was deflated after second tourniquet time of 5 minutes with good hemostasis appreciated.  The volar incision was reapproximated using interrupted sutures of #5-0 nylon.  5 mL of 0.25% Marcaine was injected along the incision sites.  A sterile compression dressing was applied   The patient tolerated the procedure well and was transported to the PACU in stable condition.  Monica Vernet P. Holley Bouche., M.D.

## 2019-01-17 NOTE — Anesthesia Preprocedure Evaluation (Signed)
Anesthesia Evaluation  Patient identified by MRN, date of birth, ID band Patient awake    Reviewed: Allergy & Precautions, NPO status , Patient's Chart, lab work & pertinent test results  History of Anesthesia Complications (+) DIFFICULT AIRWAY, PROLONGED EMERGENCE and history of anesthetic complications  Airway Mallampati: II  TM Distance: <3 FB Neck ROM: Full    Dental no notable dental hx.    Pulmonary sleep apnea and Continuous Positive Airway Pressure Ventilation , neg COPD,    breath sounds clear to auscultation- rhonchi (-) wheezing      Cardiovascular hypertension, (-) CAD, (-) Past MI, (-) Cardiac Stents and (-) CABG  Rhythm:Regular Rate:Normal - Systolic murmurs and - Diastolic murmurs    Neuro/Psych neg Seizures PSYCHIATRIC DISORDERS Anxiety Depression negative neurological ROS     GI/Hepatic Neg liver ROS, GERD  ,  Endo/Other  diabetes, Insulin Dependent  Renal/GU negative Renal ROS     Musculoskeletal  (+) Arthritis ,   Abdominal (+) - obese,   Peds  Hematology  (+) anemia ,   Anesthesia Other Findings Past Medical History: No date: Anemia No date: Anxiety No date: Cancer [facility])     Comment:  skin ca No date: Cervicalgia No date: Complication of anesthesia     Comment:  "difficult to wake up" No date: DDD (degenerative disc disease), lumbar No date: DDD (degenerative disc disease), lumbar No date: Depression No date: Diabetes mellitus without complication (H[facility]) No date: Difficult intubation No date: Fatty liver No date: GERD (gastroesophageal reflux disease) No date: Glaucoma No date: H/O scarlet fever No date: Hyperlipidemia No date: Hypersomnia with sleep apnea No date: Hypertension No date: Peripheral vascular disease (HCC) No date: Seasonal allergies No date: Sleep apnea   Reproductive/Obstetrics                             Anesthesia Physical Anesthesia  Plan  ASA: III  Anesthesia Plan: General   Post-op Pain Management:    Induction: Intravenous  PONV Risk Score and Plan: 2 and Ondansetron and Dexamethasone  Airway Management Planned: LMA  Additional Equipment:   Intra-op Plan:   Post-operative Plan:   Informed Consent: I have reviewed the patients History and Physical, chart, labs and discussed the procedure including the risks, benefits and alternatives for the proposed anesthesia with the patient or authorized representative who has indicated his/her understanding and acceptance.     Dental advisory given  Plan Discussed with: CRNA and Anesthesiologist  Anesthesia Plan Comments:         Anesthesia Quick Evaluation

## 2019-01-17 NOTE — H&P (Signed)
The patient has been re-examined, and the chart reviewed, and there have been no interval changes to the documented history and physical.    The risks, benefits, and alternatives have been discussed at length. The patient expressed understanding of the risks benefits and agreed with plans for surgical intervention.  James P. Hooten, Jr. M.D.    

## 2019-01-17 NOTE — Discharge Instructions (Signed)
°  Instructions after Hand / Wrist Surgery ° ° James P. Hooten, Jr., M.D. ° Dept. of Orthopaedics & Sports Medicine ° [facility] ° [address] ° [place],   [address] ° ° Phone: [phone]   Fax: [phone] ° ° °DIET: °• Drink plenty of non-alcoholic fluids & begin a light diet. °• Resume your normal diet the day after surgery. ° °ACTIVITY:  °• Keep the hand elevated above the level of the elbow. °• Begin gently moving the fingers on a regular basis to avoid stiffness. °• Avoid any heavy lifting, pushing, or pulling with the operative hand. °• Do not drive or operate any equipment until instructed. ° °WOUND CARE:  °• Keep the splint/bandage clean and dry.  °• The splint and stitches will be removed in the office. °• Continue to use the ice packs periodically to reduce pain and swelling. °• You may bathe or shower after the stitches are removed at the first office visit following surgery. ° °MEDICATIONS: °• You may resume your regular medications. °• Please take the pain medication as prescribed. °• Do not take pain medication on an empty stomach. °• Do not drive or drink alcoholic beverages when taking pain medications. ° °CALL THE OFFICE FOR: °• Temperature above 101 degrees °• Excessive bleeding or drainage on the dressing. °• Excessive swelling, coldness, or paleness of the fingers. °• Persistent nausea and vomiting. ° °FOLLOW-UP:  °• You should have an appointment to return to the office in 7-10 days after surgery.  ° °REMEMBER: R.I.C.E. = Rest, Ice, Compression, Elevation !  ° ° ° °AMBULATORY SURGERY  °DISCHARGE INSTRUCTIONS ° ° °1) The drugs that you were given will stay in your system until tomorrow so for the next 24 hours you should not: ° °A) Drive an automobile °B) Make any legal decisions °C) Drink any alcoholic beverage ° ° °2) You may resume regular meals tomorrow.  Today it is better to start with liquids and gradually work up to solid foods. ° °You may eat anything you prefer, but  it is better to start with liquids, then soup and crackers, and gradually work up to solid foods. ° ° °3) Please notify your doctor immediately if you have any unusual bleeding, trouble breathing, redness and pain at the surgery site, drainage, fever, or pain not relieved by medication. ° ° ° °4) Additional Instructions: ° ° ° ° ° ° ° °Please contact your physician with any problems or Same Day Surgery at [phone], Monday through Friday 6 am to 4 pm, or  at [facility] number at [phone]. °

## 2019-01-17 NOTE — Transfer of Care (Signed)
Immediate Anesthesia Transfer of Care Note  Patient: Monica Rice  Procedure(s) Performed: RELEASE DORSAL COMPARTMENT (DEQUERVAIN) (Left Wrist) REMOVAL GANGLION OF WRIST (Left Wrist)  Patient Location: PACU  Anesthesia Type:General  Level of Consciousness: awake and sedated  Airway & Oxygen Therapy: Patient Spontanous Breathing and Patient connected to face mask oxygen  Post-op Assessment: Report given to RN and Post -op Vital signs reviewed and stable  Post vital signs: Reviewed and stable  Last Vitals:  Vitals Value Taken Time  BP    Temp    Pulse    Resp    SpO2      Last Pain:  Vitals:   01/17/19 1441  TempSrc: Oral  PainSc: 0-No pain         Complications: No apparent anesthesia complications

## 2019-01-17 NOTE — Anesthesia Post-op Follow-up Note (Signed)
Anesthesia QCDR form completed.        

## 2019-01-17 NOTE — Anesthesia Postprocedure Evaluation (Signed)
Anesthesia Post Note  Patient: Monica Rice  Procedure(s) Performed: RELEASE DORSAL COMPARTMENT (DEQUERVAIN) (Left Wrist) REMOVAL GANGLION OF WRIST (Left Wrist)  Patient location during evaluation: PACU Anesthesia Type: General Level of consciousness: awake and alert Pain management: pain level controlled Vital Signs Assessment: post-procedure vital signs reviewed and stable Respiratory status: spontaneous breathing, nonlabored ventilation, respiratory function stable and patient connected to nasal cannula oxygen Cardiovascular status: blood pressure returned to baseline and stable Postop Assessment: no apparent nausea or vomiting Anesthetic complications: no     Last Vitals:  Vitals:   01/17/19 1734 01/17/19 1754  BP: (!) 143/79 129/78  Pulse: 76 80  Resp: 16 16  Temp: (!) 36.1 C   SpO2: 96% 94%    Last Pain:  Vitals:   01/17/19 1734  TempSrc: Temporal  PainSc: 3                  Precious Haws Schneider [place]

## 2019-01-19 LAB — SURGICAL PATHOLOGY

## 2019-04-26 ENCOUNTER — Other Ambulatory Visit: Payer: Self-pay | Admitting: Internal Medicine

## 2019-04-26 DIAGNOSIS — Z1231 Encounter for screening mammogram for malignant neoplasm of breast: Secondary | ICD-10-CM

## 2019-05-30 ENCOUNTER — Ambulatory Visit
Admission: RE | Admit: 2019-05-30 | Discharge: 2019-05-30 | Disposition: A | Discharge status: Home / Self Care | Payer: Medicare Other | Source: Ambulatory Visit | Attending: Internal Medicine | Admitting: Internal Medicine

## 2019-05-30 ENCOUNTER — Other Ambulatory Visit: Payer: Self-pay

## 2019-05-30 DIAGNOSIS — Z1231 Encounter for screening mammogram for malignant neoplasm of breast: Secondary | ICD-10-CM | POA: Insufficient documentation

## 2020-05-07 ENCOUNTER — Other Ambulatory Visit: Payer: Self-pay | Admitting: Internal Medicine

## 2020-05-07 DIAGNOSIS — Z1231 Encounter for screening mammogram for malignant neoplasm of breast: Secondary | ICD-10-CM

## 2020-05-30 ENCOUNTER — Ambulatory Visit
Admission: RE | Admit: 2020-05-30 | Discharge: 2020-05-30 | Disposition: A | Discharge status: Home / Self Care | Payer: Medicare Other | Source: Ambulatory Visit | Attending: Internal Medicine | Admitting: Internal Medicine

## 2020-05-30 ENCOUNTER — Other Ambulatory Visit: Payer: Self-pay

## 2020-05-30 DIAGNOSIS — Z1231 Encounter for screening mammogram for malignant neoplasm of breast: Secondary | ICD-10-CM | POA: Insufficient documentation

## 2021-01-02 ENCOUNTER — Other Ambulatory Visit: Payer: Self-pay

## 2021-01-02 ENCOUNTER — Other Ambulatory Visit
Admission: RE | Admit: 2021-01-02 | Discharge: 2021-01-02 | Disposition: A | Discharge status: Home / Self Care | Payer: Medicare Other | Source: Ambulatory Visit | Attending: Gastroenterology | Admitting: Gastroenterology

## 2021-01-02 DIAGNOSIS — Z20822 Contact with and (suspected) exposure to covid-19: Secondary | ICD-10-CM | POA: Diagnosis not present

## 2021-01-02 DIAGNOSIS — Z01812 Encounter for preprocedural laboratory examination: Secondary | ICD-10-CM | POA: Diagnosis present

## 2021-01-02 LAB — SARS CORONAVIRUS 2 (TAT 6-24 HRS): SARS Coronavirus 2: NEGATIVE

## 2021-01-03 ENCOUNTER — Encounter: Payer: Self-pay | Admitting: *Deleted

## 2021-01-04 ENCOUNTER — Ambulatory Visit
Admission: RE | Admit: 2021-01-04 | Discharge: 2021-01-04 | Disposition: A | Discharge status: Home / Self Care | Payer: Medicare Other | Attending: Gastroenterology | Admitting: Gastroenterology

## 2021-01-04 ENCOUNTER — Encounter: Payer: Self-pay | Admitting: *Deleted

## 2021-01-04 ENCOUNTER — Encounter
Admission: RE | Disposition: A | Discharge status: Home / Self Care | Payer: Self-pay | Source: Home / Self Care | Attending: Gastroenterology

## 2021-01-04 ENCOUNTER — Ambulatory Visit: Payer: Medicare Other | Admitting: Anesthesiology

## 2021-01-04 DIAGNOSIS — Z7984 Long term (current) use of oral hypoglycemic drugs: Secondary | ICD-10-CM | POA: Insufficient documentation

## 2021-01-04 DIAGNOSIS — Z794 Long term (current) use of insulin: Secondary | ICD-10-CM | POA: Diagnosis not present

## 2021-01-04 DIAGNOSIS — I10 Essential (primary) hypertension: Secondary | ICD-10-CM | POA: Diagnosis not present

## 2021-01-04 DIAGNOSIS — Z9889 Other specified postprocedural states: Secondary | ICD-10-CM | POA: Diagnosis not present

## 2021-01-04 DIAGNOSIS — Z85828 Personal history of other malignant neoplasm of skin: Secondary | ICD-10-CM | POA: Insufficient documentation

## 2021-01-04 DIAGNOSIS — Z87738 Personal history of other specified (corrected) congenital malformations of digestive system: Secondary | ICD-10-CM | POA: Diagnosis not present

## 2021-01-04 DIAGNOSIS — Z885 Allergy status to narcotic agent status: Secondary | ICD-10-CM | POA: Insufficient documentation

## 2021-01-04 DIAGNOSIS — Z881 Allergy status to other antibiotic agents status: Secondary | ICD-10-CM | POA: Diagnosis not present

## 2021-01-04 DIAGNOSIS — D124 Benign neoplasm of descending colon: Secondary | ICD-10-CM | POA: Insufficient documentation

## 2021-01-04 DIAGNOSIS — Z7982 Long term (current) use of aspirin: Secondary | ICD-10-CM | POA: Insufficient documentation

## 2021-01-04 DIAGNOSIS — Z09 Encounter for follow-up examination after completed treatment for conditions other than malignant neoplasm: Secondary | ICD-10-CM | POA: Diagnosis present

## 2021-01-04 DIAGNOSIS — E785 Hyperlipidemia, unspecified: Secondary | ICD-10-CM | POA: Diagnosis not present

## 2021-01-04 DIAGNOSIS — D123 Benign neoplasm of transverse colon: Secondary | ICD-10-CM | POA: Diagnosis not present

## 2021-01-04 DIAGNOSIS — Z79899 Other long term (current) drug therapy: Secondary | ICD-10-CM | POA: Insufficient documentation

## 2021-01-04 DIAGNOSIS — Z882 Allergy status to sulfonamides status: Secondary | ICD-10-CM | POA: Insufficient documentation

## 2021-01-04 DIAGNOSIS — K640 First degree hemorrhoids: Secondary | ICD-10-CM | POA: Diagnosis not present

## 2021-01-04 HISTORY — DX: Chronic tension-type headache, not intractable: G44.229

## 2021-01-04 HISTORY — DX: Cortical age-related cataract, unspecified eye: H25.019

## 2021-01-04 HISTORY — DX: Unspecified osteoarthritis, unspecified site: M19.90

## 2021-01-04 HISTORY — PX: COLONOSCOPY WITH PROPOFOL: SHX5780

## 2021-01-04 HISTORY — DX: Unspecified abdominal hernia without obstruction or gangrene: K46.9

## 2021-01-04 HISTORY — DX: Low back pain, unspecified: M54.50

## 2021-01-04 LAB — GLUCOSE, CAPILLARY: Glucose-Capillary: 201 mg/dL — ABNORMAL HIGH (ref 70–99)

## 2021-01-04 SURGERY — COLONOSCOPY WITH PROPOFOL
Anesthesia: General

## 2021-01-04 MED ORDER — PHENYLEPHRINE HCL (PRESSORS) 10 MG/ML IV SOLN
INTRAVENOUS | Status: AC
  Filled 2021-01-04: qty 1

## 2021-01-04 MED ORDER — PROPOFOL 500 MG/50ML IV EMUL
INTRAVENOUS | Status: DC | PRN
  Administered 2021-01-04: 125 ug/kg/min via INTRAVENOUS
  Administered 2021-01-04: 100 ug/kg/min via INTRAVENOUS

## 2021-01-04 MED ORDER — SODIUM CHLORIDE 0.9 % IV SOLN
INTRAVENOUS | Status: DC
  Administered 2021-01-04: 1000 mL via INTRAVENOUS

## 2021-01-04 MED ORDER — LIDOCAINE HCL (CARDIAC) PF 100 MG/5ML IV SOSY
PREFILLED_SYRINGE | INTRAVENOUS | Status: DC | PRN
  Administered 2021-01-04: 30 mg via INTRAVENOUS

## 2021-01-04 MED ORDER — PROPOFOL 500 MG/50ML IV EMUL
INTRAVENOUS | Status: AC
  Filled 2021-01-04: qty 50

## 2021-01-04 MED ORDER — PROPOFOL 10 MG/ML IV BOLUS
INTRAVENOUS | Status: DC | PRN
  Administered 2021-01-04 (×2): 50 mg via INTRAVENOUS

## 2021-01-04 MED ORDER — LIDOCAINE HCL (PF) 2 % IJ SOLN
INTRAMUSCULAR | Status: AC
  Filled 2021-01-04: qty 5

## 2021-01-04 NOTE — Anesthesia Postprocedure Evaluation (Signed)
Anesthesia Post Note  Patient: Monica Rice  Procedure(s) Performed: COLONOSCOPY WITH PROPOFOL  Patient location during evaluation: Endoscopy Anesthesia Type: General Level of consciousness: awake and alert Pain management: pain level controlled Vital Signs Assessment: post-procedure vital signs reviewed and stable Respiratory status: spontaneous breathing, nonlabored ventilation, respiratory function stable and patient connected to nasal cannula oxygen Cardiovascular status: blood pressure returned to baseline and stable Postop Assessment: no apparent nausea or vomiting Anesthetic complications: no   No notable events documented.   Last Vitals:  Vitals:   01/04/21 0840 01/04/21 0850  BP: 129/82 129/83  Pulse: 78 70  Resp: 16 (!) 24  Temp:    SpO2: 98% 100%    Last Pain:  Vitals:   01/04/21 0830  TempSrc: Temporal  PainSc:                  Arita Miss

## 2021-01-04 NOTE — Transfer of Care (Signed)
Immediate Anesthesia Transfer of Care Note  Patient: Monica Rice  Procedure(s) Performed: COLONOSCOPY WITH PROPOFOL  Patient Location: PACU and Endoscopy Unit  Anesthesia Type:General  Level of Consciousness: awake  Airway & Oxygen Therapy: Patient Spontanous Breathing  Post-op Assessment: Report given to RN  Post vital signs: stable  Last Vitals:  Vitals Value Taken Time  BP 112/73 01/04/21 0830  Temp 35.8 C 01/04/21 0830  Pulse 76 01/04/21 0831  Resp 17 01/04/21 0831  SpO2 97 % 01/04/21 0831  Vitals shown include unvalidated device data.  Last Pain:  Vitals:   01/04/21 0830  TempSrc: Temporal  PainSc:          Complications: No notable events documented.

## 2021-01-04 NOTE — Anesthesia Preprocedure Evaluation (Signed)
Anesthesia Evaluation  Patient identified by MRN, date of birth, ID band Patient awake  General Assessment Comment:Documented challenging airway - prior Grade 3 view with Mcgrath 3 blade  Reviewed: Allergy & Precautions, NPO status , Patient's Chart, lab work & pertinent test results  History of Anesthesia Complications (+) DIFFICULT AIRWAY, PROLONGED EMERGENCE and history of anesthetic complications  Airway Mallampati: II  TM Distance: <3 FB Neck ROM: Full    Dental no notable dental hx. (+) Teeth Intact   Pulmonary sleep apnea and Continuous Positive Airway Pressure Ventilation , neg COPD,    breath sounds clear to auscultation- rhonchi (-) wheezing      Cardiovascular hypertension, + Peripheral Vascular Disease  (-) CAD, (-) Past MI, (-) Cardiac Stents and (-) CABG  Rhythm:Regular Rate:Normal - Systolic murmurs and - Diastolic murmurs    Neuro/Psych  Headaches, neg Seizures PSYCHIATRIC DISORDERS Anxiety Depression    GI/Hepatic Neg liver ROS, GERD  ,  Endo/Other  diabetes, Insulin Dependent  Renal/GU negative Renal ROS     Musculoskeletal  (+) Arthritis ,   Abdominal (+) - obese,   Peds  Hematology  (+) anemia ,   Anesthesia Other Findings Past Medical History: No date: Anemia No date: Anxiety No date: Cancer [facility])     Comment:  skin ca No date: Cervicalgia No date: Complication of anesthesia     Comment:  "difficult to wake up" No date: DDD (degenerative disc disease), lumbar No date: DDD (degenerative disc disease), lumbar No date: Depression No date: Diabetes mellitus without complication (HCC) No date: Difficult intubation No date: Fatty liver No date: GERD (gastroesophageal reflux disease) No date: Glaucoma No date: H/O scarlet fever No date: Hyperlipidemia No date: Hypersomnia with sleep apnea No date: Hypertension No date: Peripheral vascular disease (HCC) No date: Seasonal allergies No date:  Sleep apnea   Reproductive/Obstetrics                             Anesthesia Physical  Anesthesia Plan  ASA: 3  Anesthesia Plan: General   Post-op Pain Management:    Induction: Intravenous  PONV Risk Score and Plan: 3 and Ondansetron, TIVA and Propofol infusion  Airway Management Planned: Natural Airway  Additional Equipment: None  Intra-op Plan:   Post-operative Plan:   Informed Consent: I have reviewed the patients History and Physical, chart, labs and discussed the procedure including the risks, benefits and alternatives for the proposed anesthesia with the patient or authorized representative who has indicated his/her understanding and acceptance.     Dental advisory given  Plan Discussed with: CRNA and Anesthesiologist  Anesthesia Plan Comments: (Discussed risks of anesthesia with patient, including possibility of difficulty with spontaneous ventilation under anesthesia necessitating airway intervention, PONV, and rare risks such as cardiac or respiratory or neurological events, and allergic reactions. Patient understands.)        Anesthesia Quick Evaluation

## 2021-01-04 NOTE — Op Note (Signed)
[facility] Gastroenterology Patient Name: Monica Rice Procedure Date: 01/04/2021 7:07 AM MRN: [id number] Account #: [id number] Date of Birth: 09/21/1950 Admit Type: Outpatient Age: 70 Room: [facility] ENDO ROOM 3 Gender: Female Note Status: Finalized Procedure:             Colonoscopy Indications:           Surveillance: Personal history of adenomatous polyps                         on last colonoscopy 3 years ago Providers:             Andrey Farmer MD, MD Referring MD:          Tracie Harrier, MD (Referring MD) Medicines:             Monitored Anesthesia Care Complications:         No immediate complications. Estimated blood loss:                         Minimal. Procedure:             Pre-Anesthesia Assessment:                        - Prior to the procedure, a History and Physical was                         performed, and patient medications and allergies were                         reviewed. The patient is competent. The risks and                         benefits of the procedure and the sedation options and                         risks were discussed with the patient. All questions                         were answered and informed consent was obtained.                         Patient identification and proposed procedure were                         verified by the physician, the nurse, the anesthetist                         and the technician in the endoscopy suite. Mental                         Status Examination: alert and oriented. Airway                         Examination: normal oropharyngeal airway and neck                         mobility. Respiratory Examination: clear to  auscultation. CV Examination: normal. Prophylactic                         Antibiotics: The patient does not require prophylactic                         antibiotics. Prior Anticoagulants: The patient has                         taken no previous  anticoagulant or antiplatelet                         agents. ASA Grade Assessment: III - A patient with                         severe systemic disease. After reviewing the risks and                         benefits, the patient was deemed in satisfactory                         condition to undergo the procedure. The anesthesia                         plan was to use monitored anesthesia care (MAC).                         Immediately prior to administration of medications,                         the patient was re-assessed for adequacy to receive                         sedatives. The heart rate, respiratory rate, oxygen                         saturations, blood pressure, adequacy of pulmonary                         ventilation, and response to care were monitored                         throughout the procedure. The physical status of the                         patient was re-assessed after the procedure.                        After obtaining informed consent, the colonoscope was                         passed under direct vision. Throughout the procedure,                         the patient's blood pressure, pulse, and oxygen                         saturations were monitored continuously. The  Colonoscope was introduced through the anus and                         advanced to the the cecum, identified by appendiceal                         orifice and ileocecal valve. The colonoscopy was                         performed without difficulty. The patient tolerated                         the procedure well. The quality of the bowel                         preparation was adequate to identify polyps. Findings:      The perianal and digital rectal examinations were normal.      The terminal ileum appeared normal.      Six sessile polyps were found in the transverse colon. The polyps were 2       to 4 mm in size. These polyps were removed with a cold snare.  Resection       and retrieval were complete. Estimated blood loss was minimal.      Two sessile polyps were found in the descending colon. The polyps were 9       to 10 mm in size. These polyps were removed with a hot snare. Resection       and retrieval were complete. Estimated blood loss was minimal.      Two sessile polyps were found in the sigmoid colon. The polyps were 2 to       4 mm in size. These polyps were removed with a cold snare. Resection and       retrieval were complete. Estimated blood loss was minimal.      Internal hemorrhoids were found during retroflexion. The hemorrhoids       were Grade I (internal hemorrhoids that do not prolapse).      The exam was otherwise without abnormality on direct and retroflexion       views. Impression:            - The examined portion of the ileum was normal.                        - Six 2 to 4 mm polyps in the transverse colon,                         removed with a cold snare. Resected and retrieved.                        - Two 9 to 10 mm polyps in the descending colon,                         removed with a hot snare. Resected and retrieved.                        - Two 2 to 4 mm polyps in the sigmoid colon, removed  with a cold snare. Resected and retrieved.                        - Internal hemorrhoids.                        - The examination was otherwise normal on direct and                         retroflexion views. Recommendation:        - Discharge patient to home.                        - Resume previous diet.                        - Continue present medications.                        - Await pathology results.                        - Repeat colonoscopy in 1 year for surveillance based                         on pathology results.                        - Return to referring physician as previously                         scheduled. Procedure Code(s):     --- Professional ---                         [id number], Colonoscopy, flexible; with removal of                         tumor(s), polyp(s), or other lesion(s) by snare                         technique Diagnosis Code(s):     --- Professional ---                        Z86.010, Personal history of colonic polyps                        K64.0, First degree hemorrhoids                        K63.5, Polyp of colon CPT copyright 2019 [employer] Association. All rights reserved. The codes documented in this report are preliminary and upon coder review may  be revised to meet current compliance requirements. Andrey Farmer MD, MD 01/04/2021 8:30:36 AM Number of Addenda: 0 Note Initiated On: 01/04/2021 7:07 AM Scope Withdrawal Time: 0 hours 19 minutes 8 seconds  Total Procedure Duration: 0 hours 37 minutes 47 seconds  Estimated Blood Loss:  Estimated blood loss was minimal.      [facility]

## 2021-01-04 NOTE — H&P (Signed)
Outpatient short stay form Pre-procedure 01/04/2021 7:39 AM Monica Miyamoto MD, MPH  Primary Physician: Dr. Ginette Pitman  Reason for visit:  Surveillance  History of present illness:   70 y/o lady with history of hypertension, hyperlipidemia, and chronic diarrhea here for surveillance colonoscopy for history of polyps. No family history of GI malignancies. No blood thinners. History of meckel's diverticulum surgery and hernia repairs.    Current Facility-Administered Medications:    0.9 %  sodium chloride infusion, , Intravenous, Continuous, Jonavin Seder, Hilton Cork, MD, Last Rate: 20 mL/hr at 01/04/21 0725, 1,000 mL at 01/04/21 0725  Medications Prior to Admission  Medication Sig Dispense Refill Last Dose   ACCU-CHEK AVIVA PLUS test strip    01/04/2021   amLODipine (NORVASC) 5 MG tablet Take 5 mg by mouth daily.    01/03/2021   aspirin 81 MG EC tablet Take 81 mg by mouth at bedtime. Swallow whole.    01/03/2021   atorvastatin (LIPITOR) 20 MG tablet Take 20 mg by mouth at bedtime.   01/03/2021   Biotin w/ Vitamins C & E (HAIR/SKIN/NAILS PO) Take 1 tablet by mouth 2 (two) times daily.   01/03/2021   buPROPion (WELLBUTRIN XL) 150 MG 24 hr tablet Take 150 mg by mouth at bedtime.   1 01/03/2021   Calcium Carb-Cholecalciferol (CALCIUM 600 + D PO) Take 1 tablet by mouth daily.    01/03/2021   cholestyramine (QUESTRAN) 4 g packet Take 4 g by mouth daily as needed.   Past Week   clonazePAM (KLONOPIN) 0.5 MG tablet Take 0.5 mg by mouth daily as needed for anxiety.    Past Week   ferrous sulfate 325 (65 FE) MG tablet Take 325 mg by mouth daily.    01/03/2021   FLUoxetine (PROZAC) 20 MG capsule Take 40 mg by mouth daily.    01/03/2021   gabapentin (NEURONTIN) 100 MG capsule Take 100 mg by mouth at bedtime.  3 01/03/2021   glucose blood test strip Use 3 (three) times daily. Use as instructed.   01/04/2021   hydrochlorothiazide (HYDRODIURIL) 25 MG tablet Take 25 mg by mouth daily.    01/03/2021   insulin NPH-regular Human (NOVOLIN  70/30) (70-30) 100 UNIT/ML injection Inject 20-70 Units into the skin 2 (two) times daily before a meal. Take 20 units in the morning and 60 units in the evening.   01/03/2021   latanoprost (XALATAN) 0.005 % ophthalmic solution Place 1 drop into both eyes at bedtime.    01/03/2021   Magnesium 250 MG TABS Take by mouth daily.   01/03/2021   metFORMIN (GLUCOPHAGE) 1000 MG tablet Take 1,000 mg by mouth 2 (two) times daily.    01/03/2021   mometasone (ELOCON) 0.1 % lotion Apply 2-3 drops topically daily as needed (for ear itching due to dermatitis).   01/03/2021   Multiple Vitamin (MULTIVITAMIN WITH MINERALS) TABS tablet Take 1 tablet by mouth daily. Centrum Silver   01/03/2021   omeprazole (PRILOSEC) 20 MG capsule Take 20 mg by mouth 2 (two) times daily.    01/03/2021   traMADol (ULTRAM) 50 MG tablet Take 1 tablet (50 mg total) by mouth every 6 (six) hours as needed for moderate pain. 20 tablet 0 Past Week   vitamin B-12 (CYANOCOBALAMIN) 1000 MCG tablet Take 1,000 mcg by mouth daily.  11 01/03/2021   colestipol (COLESTID) 1 g tablet Take 1 g by mouth daily. (Patient not taking: Reported on 01/04/2021)   Not Taking   Dulaglutide 0.75 MG/0.5ML SOPN Inject 0.75  mg into the skin every Monday.       fluticasone (FLONASE) 50 MCG/ACT nasal spray Place 2 sprays into both nostrils daily.    at prn     Allergies  Allergen Reactions   Hydrocodone Other (See Comments)    Hallucinations   Septra [Sulfamethoxazole-Trimethoprim] Other (See Comments)    Chest pain, headache and rash   Neomycin-Bacitracin-Polymyxin [Bacitracin-Neomycin-Polymyxin] Rash and Other (See Comments)    Increases infection.   Sulfa Antibiotics Rash and Other (See Comments)    Chest pains, headache and rash     Past Medical History:  Diagnosis Date   Abdominal hernia    repaired   Anemia    Anxiety    Cancer ([place])    skin ca   Cervicalgia    Chronic tension headaches    Complication of anesthesia    "difficult to wake up"   Cortical senile  cataract    DDD (degenerative disc disease), lumbar    DDD (degenerative disc disease), lumbar    Depression    Diabetes mellitus without complication ([place])    Difficult intubation    Fatty liver    GERD (gastroesophageal reflux disease)    Glaucoma    H/O scarlet fever    Hyperlipidemia    Hypersomnia with sleep apnea    Hypertension    Lumbago    Osteoarthrosis    Peripheral vascular disease (HCC)    Seasonal allergies    Sleep apnea     Review of systems:  Otherwise negative.    Physical Exam  Gen: Alert, oriented. Appears stated age.  HEENT: PERRLA. Lungs: No respiratory distress CV: RRR Abd: soft, benign, no masses Ext: No edema   Planned procedures: Proceed with colonoscopy. The patient understands the nature of the planned procedure, indications, risks, alternatives and potential complications including but not limited to bleeding, infection, perforation, damage to internal organs and possible oversedation/side effects from anesthesia. The patient agrees and gives consent to proceed.  Please refer to procedure notes for findings, recommendations and patient disposition/instructions.     Monica Miyamoto MD, MPH Gastroenterology 01/04/2021  7:39 AM

## 2021-01-04 NOTE — Interval H&P Note (Signed)
History and Physical Interval Note:  01/04/2021 7:42 AM  Monica Rice  has presented today for surgery, with the diagnosis of history of adenomatous polyp of colon.  The various methods of treatment have been discussed with the patient and family. After consideration of risks, benefits and other options for treatment, the patient has consented to  Procedure(s) with comments: COLONOSCOPY WITH PROPOFOL (N/A) - DM as a surgical intervention.  The patient's history has been reviewed, patient examined, no change in status, stable for surgery.  I have reviewed the patient's chart and labs.  Questions were answered to the patient's satisfaction.     Lesly Rubenstein  Ok to proceed with colonoscopy

## 2021-01-07 ENCOUNTER — Encounter: Payer: Self-pay | Admitting: Gastroenterology

## 2021-01-07 LAB — SURGICAL PATHOLOGY

## 2021-05-09 ENCOUNTER — Other Ambulatory Visit: Payer: Self-pay | Admitting: Internal Medicine

## 2021-05-09 DIAGNOSIS — Z1231 Encounter for screening mammogram for malignant neoplasm of breast: Secondary | ICD-10-CM

## 2021-06-23 ENCOUNTER — Encounter: Payer: Self-pay | Admitting: Emergency Medicine

## 2021-06-23 ENCOUNTER — Other Ambulatory Visit: Payer: Self-pay

## 2021-06-23 ENCOUNTER — Observation Stay
Admission: EM | Admit: 2021-06-23 | Discharge: 2021-06-24 | Disposition: A | Discharge status: Home / Self Care | Payer: Medicare Other | Attending: Internal Medicine | Admitting: Internal Medicine

## 2021-06-23 ENCOUNTER — Emergency Department: Payer: Medicare Other

## 2021-06-23 DIAGNOSIS — M5136 Other intervertebral disc degeneration, lumbar region: Secondary | ICD-10-CM | POA: Diagnosis not present

## 2021-06-23 DIAGNOSIS — Z7984 Long term (current) use of oral hypoglycemic drugs: Secondary | ICD-10-CM | POA: Diagnosis not present

## 2021-06-23 DIAGNOSIS — Z96642 Presence of left artificial hip joint: Secondary | ICD-10-CM | POA: Insufficient documentation

## 2021-06-23 DIAGNOSIS — E119 Type 2 diabetes mellitus without complications: Secondary | ICD-10-CM

## 2021-06-23 DIAGNOSIS — Z794 Long term (current) use of insulin: Secondary | ICD-10-CM | POA: Diagnosis not present

## 2021-06-23 DIAGNOSIS — M51369 Other intervertebral disc degeneration, lumbar region without mention of lumbar back pain or lower extremity pain: Secondary | ICD-10-CM | POA: Diagnosis present

## 2021-06-23 DIAGNOSIS — Z79899 Other long term (current) drug therapy: Secondary | ICD-10-CM | POA: Insufficient documentation

## 2021-06-23 DIAGNOSIS — H409 Unspecified glaucoma: Secondary | ICD-10-CM | POA: Diagnosis not present

## 2021-06-23 DIAGNOSIS — Z85828 Personal history of other malignant neoplasm of skin: Secondary | ICD-10-CM | POA: Diagnosis not present

## 2021-06-23 DIAGNOSIS — I10 Essential (primary) hypertension: Secondary | ICD-10-CM | POA: Diagnosis present

## 2021-06-23 DIAGNOSIS — Z7982 Long term (current) use of aspirin: Secondary | ICD-10-CM | POA: Diagnosis not present

## 2021-06-23 DIAGNOSIS — K219 Gastro-esophageal reflux disease without esophagitis: Secondary | ICD-10-CM

## 2021-06-23 DIAGNOSIS — E1165 Type 2 diabetes mellitus with hyperglycemia: Secondary | ICD-10-CM | POA: Diagnosis not present

## 2021-06-23 DIAGNOSIS — R0789 Other chest pain: Principal | ICD-10-CM | POA: Diagnosis present

## 2021-06-23 DIAGNOSIS — Z7985 Long-term (current) use of injectable non-insulin antidiabetic drugs: Secondary | ICD-10-CM | POA: Diagnosis not present

## 2021-06-23 DIAGNOSIS — E782 Mixed hyperlipidemia: Secondary | ICD-10-CM | POA: Diagnosis not present

## 2021-06-23 DIAGNOSIS — R29898 Other symptoms and signs involving the musculoskeletal system: Secondary | ICD-10-CM

## 2021-06-23 LAB — BASIC METABOLIC PANEL
Anion gap: 8 (ref 5–15)
BUN: 19 mg/dL (ref 8–23)
CO2: 27 mmol/L (ref 22–32)
Calcium: 9 mg/dL (ref 8.9–10.3)
Chloride: 103 mmol/L (ref 98–111)
Creatinine, Ser: 0.71 mg/dL (ref 0.44–1.00)
GFR, Estimated: >60 mL/min (ref >60)
Glucose, Bld: 271 mg/dL — ABNORMAL HIGH (ref 70–99)
Potassium: 3.9 mmol/L (ref 3.5–5.1)
Sodium: 138 mmol/L (ref 135–145)

## 2021-06-23 LAB — TROPONIN I (HIGH SENSITIVITY)
Troponin I (High Sensitivity): 5 ng/L (ref <18)
Troponin I (High Sensitivity): <2 ng/L (ref <18)

## 2021-06-23 LAB — CBC
HCT: 32.6 % — ABNORMAL LOW (ref 36.0–46.0)
Hemoglobin: 10.6 g/dL — ABNORMAL LOW (ref 12.0–15.0)
MCH: 29.6 pg (ref 26.0–34.0)
MCHC: 32.5 g/dL (ref 30.0–36.0)
MCV: 91.1 fL (ref 80.0–100.0)
Platelets: 209 10*3/uL (ref 150–400)
RBC: 3.58 MIL/uL — ABNORMAL LOW (ref 3.87–5.11)
RDW: 13.2 % (ref 11.5–15.5)
WBC: 7.3 10*3/uL (ref 4.0–10.5)
nRBC: 0 % (ref 0.0–0.2)

## 2021-06-23 LAB — MAGNESIUM: Magnesium: 2 mg/dL (ref 1.7–2.4)

## 2021-06-23 LAB — CBG MONITORING, ED: Glucose-Capillary: 283 mg/dL — ABNORMAL HIGH (ref 70–99)

## 2021-06-23 MED ORDER — ENOXAPARIN SODIUM 40 MG/0.4ML IJ SOSY
40.0000 mg | PREFILLED_SYRINGE | INTRAMUSCULAR | Status: DC
  Administered 2021-06-23: 40 mg via SUBCUTANEOUS
  Filled 2021-06-23: qty 0.4

## 2021-06-23 MED ORDER — INSULIN GLARGINE-YFGN 100 UNIT/ML ~~LOC~~ SOLN
10.0000 [IU] | Freq: Every day | SUBCUTANEOUS | Status: DC
  Administered 2021-06-23: 10 [IU] via SUBCUTANEOUS
  Filled 2021-06-23 (×2): qty 0.1

## 2021-06-23 MED ORDER — INSULIN ASPART 100 UNIT/ML IJ SOLN
0.0000 [IU] | Freq: Every day | INTRAMUSCULAR | Status: DC
  Administered 2021-06-23: 3 [IU] via SUBCUTANEOUS
  Filled 2021-06-23: qty 1

## 2021-06-23 MED ORDER — ASPIRIN 81 MG PO CHEW
324.0000 mg | CHEWABLE_TABLET | Freq: Once | ORAL | Status: AC
  Administered 2021-06-23: 324 mg via ORAL
  Filled 2021-06-23: qty 4

## 2021-06-23 MED ORDER — INSULIN ASPART 100 UNIT/ML IJ SOLN
0.0000 [IU] | Freq: Three times a day (TID) | INTRAMUSCULAR | Status: DC
  Administered 2021-06-24: 5 [IU] via SUBCUTANEOUS
  Filled 2021-06-23: qty 1

## 2021-06-23 NOTE — ED Provider Notes (Signed)
[facility] Provider Note    Event Date/Time   First MD Initiated Contact with Patient 06/23/21 1349     (approximate)   History   Irregular Heart Beat   HPI  Monica Rice is a 71 y.o. female who presents to the ED for evaluation of Irregular Heart Beat   I reviewed PCP visit from 12/16.  Diagnosed with influenza then.  She is a history of HTN, HLD and DM.  Anxiety. Does follow with [facility] clinic cardiology, Dr. Nehemiah Massed, for carotid and aortic atherosclerosis on aspirin.  Patient presents to the ED, accompanied by her husband, for evaluation of chest discomfort for the past couple days.  She reports an episode of chest pressure on Friday while seated that was reminiscent of GERD, lasting a couple hours before self resolving.  Since then, she has felt a "squiggly" sensation to her chest when she walks and exerts herself.  She has a difficult time explaining this sensation, denying pressure or pain, but reporting a normal sensation in her chest with ambulation that improves with rest.  Reports feeling somewhat weak in her bilateral legs right now, but otherwise fine.  Denies any sensation in her chest, abdomen or back right now.   Physical Exam   Triage Vital Signs: ED Triage Vitals  Enc Vitals Group     BP 06/23/21 1343 (!) 150/86     Pulse Rate 06/23/21 1343 96     Resp 06/23/21 1343 20     Temp 06/23/21 1343 98.2 F (36.8 C)     Temp Source 06/23/21 1343 Oral     SpO2 06/23/21 1343 98 %     Weight 06/23/21 1341 180 lb (81.6 kg)     Height 06/23/21 1341 5\' 5"  (1.651 m)     Head Circumference --      Peak Flow --      Pain Score 06/23/21 1341 0     Pain Loc --      Pain Edu? --      Excl. in [place]? --     Most recent vital signs: Vitals:   06/23/21 1430 06/23/21 1445  BP: 125/90   Pulse:  92  Resp:  17  Temp:    SpO2:  91%    General: Awake, no distress.  CV:  Good peripheral perfusion.  Resp:  Normal effort.  Abd:  No  distention.  MSK:  No deformity noted.  Neuro:  No focal deficits appreciated. Other:     ED Results / Procedures / Treatments   Labs (all labs ordered are listed, but only abnormal results are displayed) Labs Reviewed  BASIC METABOLIC PANEL - Abnormal; Notable for the following components:      Result Value   Glucose, Bld 271 (*)    All other components within normal limits  CBC - Abnormal; Notable for the following components:   RBC 3.58 (*)    Hemoglobin 10.6 (*)    HCT 32.6 (*)    All other components within normal limits  MAGNESIUM  TROPONIN I (HIGH SENSITIVITY)    EKG  Sinus rhythm, rate of 96 bpm.  Normal axis and intervals.  No STEMI, but does have some nonspecific ST changes to inferior leads. Please see new in comparison to EKG from August 2020.  RADIOLOGY CXR reviewed by me without evidence of acute cardiopulmonary pathology.  Official radiology report(s): DG Chest 2 View  Result Date: 06/23/2021 CLINICAL DATA:  Irregular heart rate EXAM: CHEST -  2 VIEW COMPARISON:  Chest x-ray 06/04/2018 FINDINGS: Heart size and mediastinal contours are within normal limits. No suspicious pulmonary opacities identified. No pleural effusion or pneumothorax visualized. No acute osseous abnormality appreciated. IMPRESSION: No acute intrathoracic process identified. Electronically Signed   By: Ofilia Neas M.D.   On: 06/23/2021 14:13    PROCEDURES and INTERVENTIONS:  .1-3 Lead EKG Interpretation Performed by: Vladimir Crofts, MD Authorized by: Vladimir Crofts, MD     Interpretation: normal     ECG rate:  90   ECG rate assessment: normal     Rhythm: sinus rhythm     Ectopy: none     Conduction: normal    Medications  aspirin chewable tablet 324 mg (has no administration in time range)     IMPRESSION / MDM / ASSESSMENT AND PLAN / ED COURSE  I reviewed the triage vital signs and the nursing notes.  71 year old female with multiple risk factors presents to the ED with  chest discomfort concerning for stable angina.  Vitals are normal on room air and she has no chest discomfort here while seated.  Her EKG has some nonspecific changes inferiorly, but certainly no STEMI.  First troponin is negative and her basic labs are benign.  Hyperglycemia without acidosis and mild normocytic anemia without symptoms of blood loss, or recent comparisons.  Considering her risk factors, nonspecific EKG changes and exertional symptoms I am concerned this may represent new stable angina.  She is initially hesitant about observation admission, so patient will be signed out to oncoming provider to follow-up on a second troponin and reevaluate the patient.  Clinical Course as of 06/23/21 1454  Sun Jun 23, 2021  1434 I discussed with the patient my concerns for stable angina.  We discussed nonspecific EKG and symptoms pending troponins.  We discussed the limitations of these tests.  We discussed her risk factors for coronary disease and my concern that regardless of these tests that she may benefit from observation admission. [DS]    Clinical Course User Index [DS] Vladimir Crofts, MD     FINAL CLINICAL IMPRESSION(S) / ED DIAGNOSES   Final diagnoses:  Other chest pain     Rx / DC Orders   ED Discharge Orders     None        Note:  This document was prepared using Dragon voice recognition software and may include unintentional dictation errors.   Vladimir Crofts, MD 06/23/21 [phone]

## 2021-06-23 NOTE — ED Notes (Signed)
Report received from Crystal, RN

## 2021-06-23 NOTE — H&P (Signed)
History and Physical  Monica Rice YKD:[id number] DOB: [birth year] DOA: 06/23/2021  Referring physician: Vladimir Crofts, MD PCP: Monica Harrier, MD  Patient coming from: Home  Chief Complaint: Abnormal heartbeats  HPI: Monica Rice is a 71 y.o. female with medical history significant for hypertension, hyperlipidemia, T2DM, GERD, glaucoma, DDD who presents to the emergency department accompanied by her husband due to 2-day onset of chest discomfort.  She complained of an episode of chest discomfort 2 days ago (1/20) which she thought was due to gas, she took an over-the-counter anti-gas medication with improvement of the chest discomfort, however, she complained of abnormal sensation/palpitations in chest on walking or exerting herself and she also complained of leg weakness which worsens when she stands.  She denies fever, chills, shortness of breath, headache, nausea or vomiting.  ED Course:  In the emergency department, she was hemodynamically stable.  Work-up in the ED showed normocytic anemia and normal BMP except for hyperglycemia, troponin x2 was negative, magnesium was 2.0. Chest x-ray showed no acute intrathoracic process  Review of Systems: A full 10 point Review of Systems was done, except as stated above, all other Review of systems were negative.  Past Medical History:  Diagnosis Date   Abdominal hernia    repaired   Anemia    Anxiety    Cancer ([place])    skin ca   Cervicalgia    Chronic tension headaches    Complication of anesthesia    "difficult to wake up"   Cortical senile cataract    DDD (degenerative disc disease), lumbar    DDD (degenerative disc disease), lumbar    Depression    Diabetes mellitus without complication ([place])    Difficult intubation    Fatty liver    GERD (gastroesophageal reflux disease)    Glaucoma    H/O scarlet fever    Hyperlipidemia    Hypersomnia with sleep apnea    Hypertension    Lumbago    Osteoarthrosis    Peripheral  vascular disease (HCC)    Seasonal allergies    Sleep apnea    Past Surgical History:  Procedure Laterality Date   APPENDECTOMY     CARPAL TUNNEL RELEASE Bilateral    CATARACT EXTRACTION, BILATERAL     CESAREAN SECTION     x2    CHOLECYSTECTOMY     COLONOSCOPY     COLONOSCOPY WITH PROPOFOL N/A 01/25/2018   Procedure: COLONOSCOPY WITH PROPOFOL;  Surgeon: Manya Silvas, MD;  Location: [facility] ENDOSCOPY;  Service: Endoscopy;  Laterality: N/A;   COLONOSCOPY WITH PROPOFOL N/A 01/04/2021   Procedure: COLONOSCOPY WITH PROPOFOL;  Surgeon: Lesly Rubenstein, MD;  Location: [facility] ENDOSCOPY;  Service: Endoscopy;  Laterality: N/A;  DM   DORSAL COMPARTMENT RELEASE Left 01/17/2019   Procedure: RELEASE DORSAL COMPARTMENT (DEQUERVAIN);  Surgeon: Dereck Leep, MD;  Location: [facility] ORS;  Service: Orthopedics;  Laterality: Left;   ESOPHAGOGASTRODUODENOSCOPY     EYE SURGERY     GANGLION CYST EXCISION Left 01/17/2019   Procedure: REMOVAL GANGLION OF WRIST;  Surgeon: Dereck Leep, MD;  Location: [facility] ORS;  Service: Orthopedics;  Laterality: Left;   HERNIA REPAIR     JOINT REPLACEMENT Bilateral    LAPAROSCOPY N/A 01/25/2016   Procedure: LAPAROSCOPY DIAGNOSTIC;  Surgeon: Jules Husbands, MD;  Location: [facility] ORS;  Service: General;  Laterality: N/A;   LAPAROTOMY N/A 01/25/2016   Procedure: EXPLORATORY LAPAROTOMY;  Surgeon: Jules Husbands, MD;  Location: [facility] ORS;  Service: General;  Laterality: N/A;   TOTAL HIP ARTHROPLASTY Left 03/11/2017   Procedure: TOTAL HIP ARTHROPLASTY;  Surgeon: Dereck Leep, MD;  Location: [facility] ORS;  Service: Orthopedics;  Laterality: Left;   tumor rt foot Right    varicose      Social History:  reports that she has never smoked. She has never used smokeless tobacco. She reports that she does not drink alcohol and does not use drugs.   Allergies  Allergen Reactions   Hydrocodone Other (See Comments)    Hallucinations   Septra [Sulfamethoxazole-Trimethoprim] Other (See  Comments)    Chest pain, headache and rash   Neomycin-Bacitracin-Polymyxin [Bacitracin-Neomycin-Polymyxin] Rash and Other (See Comments)    Increases infection.   Sulfa Antibiotics Rash and Other (See Comments)    Chest pains, headache and rash    Family History  Problem Relation Age of Onset   Hypertension Mother    COPD Mother    Hyperlipidemia Mother    Heart disease Mother    Rheum arthritis Mother    Kidney disease Father    Heart disease Father    Kidney failure Father    Hyperlipidemia Sister    Hypertension Sister    Skin cancer Sister    Colon polyps Sister    Breast cancer Neg Hx      Prior to Admission medications   Medication Sig Start Date End Date Taking? Authorizing Provider  ACCU-CHEK AVIVA PLUS test strip  12/20/15   [provider]  amLODipine (NORVASC) 5 MG tablet Take 5 mg by mouth daily.  01/17/16   [provider]  aspirin 81 MG EC tablet Take 81 mg by mouth at bedtime. Swallow whole.     [provider]  atorvastatin (LIPITOR) 20 MG tablet Take 20 mg by mouth at bedtime.    [provider]  Biotin w/ Vitamins C & E (HAIR/SKIN/NAILS PO) Take 1 tablet by mouth 2 (two) times daily.    [provider]  buPROPion (WELLBUTRIN XL) 150 MG 24 hr tablet Take 150 mg by mouth at bedtime.  02/11/17   [provider]  Calcium Carb-Cholecalciferol (CALCIUM 600 + D PO) Take 1 tablet by mouth daily.     [provider]  cholestyramine (QUESTRAN) 4 g packet Take 4 g by mouth daily as needed.    [provider]  clonazePAM (KLONOPIN) 0.5 MG tablet Take 0.5 mg by mouth daily as needed for anxiety.  01/26/15   [provider]  colestipol (COLESTID) 1 g tablet Take 1 g by mouth daily. Patient not taking: Reported on 01/04/2021    [provider]  Dulaglutide 0.75 MG/0.5ML SOPN Inject 0.75 mg into the skin every Monday.     [provider]  ferrous sulfate 325 (65 FE) MG tablet Take 325  mg by mouth daily.  12/28/15   [provider]  FLUoxetine (PROZAC) 20 MG capsule Take 40 mg by mouth daily.  01/03/16   [provider]  fluticasone (FLONASE) 50 MCG/ACT nasal spray Place 2 sprays into both nostrils daily.    [provider]  gabapentin (NEURONTIN) 100 MG capsule Take 100 mg by mouth at bedtime. 02/11/17   [provider]  glucose blood test strip Use 3 (three) times daily. Use as instructed. 02/07/16   [provider]  hydrochlorothiazide (HYDRODIURIL) 25 MG tablet Take 25 mg by mouth daily.  11/23/15   [provider]  insulin NPH-regular Human (NOVOLIN 70/30) (70-30) 100 UNIT/ML injection Inject 20-70 Units  into the skin 2 (two) times daily before a meal. Take 20 units in the morning and 60 units in the evening.    [provider]  latanoprost (XALATAN) 0.005 % ophthalmic solution Place 1 drop into both eyes at bedtime.  01/19/16   [provider]  Magnesium 250 MG TABS Take by mouth daily.    [provider]  metFORMIN (GLUCOPHAGE) 1000 MG tablet Take 1,000 mg by mouth 2 (two) times daily.  01/03/16   [provider]  mometasone (ELOCON) 0.1 % lotion Apply 2-3 drops topically daily as needed (for ear itching due to dermatitis).    [provider]  Multiple Vitamin (MULTIVITAMIN WITH MINERALS) TABS tablet Take 1 tablet by mouth daily. Centrum Silver    [provider]  omeprazole (PRILOSEC) 20 MG capsule Take 20 mg by mouth 2 (two) times daily.  08/15/15   [provider]  traMADol (ULTRAM) 50 MG tablet Take 1 tablet (50 mg total) by mouth every 6 (six) hours as needed for moderate pain. 01/17/19   Hooten, Laurice Record, MD  vitamin B-12 (CYANOCOBALAMIN) 1000 MCG tablet Take 1,000 mcg by mouth daily. 12/28/15   [provider]    Physical Exam: BP 122/80    Pulse 84    Temp 98.2 F (36.8 C) (Oral)    Resp 14    Ht 5\' 5"  (1.651 m)    Wt 81.6 kg    SpO2 95%    BMI 29.95  kg/m   General: 71 y.o. year-old female well developed well nourished in no acute distress.  Alert and oriented x3. HEENT: NCAT, EOMI Neck: Supple, trachea medial Cardiovascular: Regular rate and rhythm with no rubs or gallops.  No thyromegaly or JVD noted.  No lower extremity edema. 2/4 pulses in all 4 extremities. Respiratory: Clear to auscultation with no wheezes or rales. Good inspiratory effort. Abdomen: Soft, nontender nondistended with normal bowel sounds x4 quadrants. Muskuloskeletal: No cyanosis, clubbing or edema noted bilaterally Neuro: CN II-XII intact, strength 5/5 x 4, sensation, reflexes intact Skin: No ulcerative lesions noted or rashes Psychiatry: Judgement and insight appear normal. Mood is appropriate for condition and setting          Labs on Admission:  Basic Metabolic Panel: Recent Labs  Lab 06/23/21 1410  NA 138  K 3.9  CL 103  CO2 27  GLUCOSE 271*  BUN 19  CREATININE 0.71  CALCIUM 9.0  MG 2.0   Liver Function Tests: No results for input(s): AST, ALT, ALKPHOS, BILITOT, PROT, ALBUMIN in the last 168 hours. No results for input(s): LIPASE, AMYLASE in the last 168 hours. No results for input(s): AMMONIA in the last 168 hours. CBC: Recent Labs  Lab 06/23/21 1410  WBC 7.3  HGB 10.6*  HCT 32.6*  MCV 91.1  PLT 209   Cardiac Enzymes: No results for input(s): CKTOTAL, CKMB, CKMBINDEX, TROPONINI in the last 168 hours.  BNP (last 3 results) No results for input(s): BNP in the last 8760 hours.  ProBNP (last 3 results) No results for input(s): PROBNP in the last 8760 hours.  CBG: No results for input(s): GLUCAP in the last 168 hours.  Radiological Exams on Admission: DG Chest 2 View  Result Date: 06/23/2021 CLINICAL DATA:  Irregular heart rate EXAM: CHEST - 2 VIEW COMPARISON:  Chest x-ray 06/04/2018 FINDINGS: Heart size and mediastinal contours are within normal limits. No suspicious pulmonary opacities identified. No pleural effusion or  pneumothorax visualized. No acute osseous abnormality appreciated. IMPRESSION:  No acute intrathoracic process identified. Electronically Signed   By: Ofilia Neas M.D.   On: 06/23/2021 14:13    EKG: I independently viewed the EKG done and my findings are as followed: Normal sinus rhythm at a rate of 96 bpm  Assessment/Plan Present on Admission:  Atypical chest pain  Mixed hyperlipidemia  Benign essential hypertension  DDD (degenerative disc disease), lumbar  Principal Problem:   Atypical chest pain Active Problems:   Benign essential hypertension   DDD (degenerative disc disease), lumbar   Mixed hyperlipidemia   Hyperglycemia due to diabetes mellitus (HCC)   GERD (gastroesophageal reflux disease)   Glaucoma   Leg weakness, bilateral  Atypical chest pain Heart score = 4 Cardiovascular risk factors include hypertension, hyperlipidemia, T2DM Continue telemetry  Troponins x2 was negative EKG showed normal sinus rhythm at a rate of 96 bpm Cardiology was consulted to help decide if Stress test is needed in am Versus other  diagnostic modalities.    Continue aspirin 81 mg p.o. daily  Hyperglycemia secondary to T2DM  Continue ISS and hypoglycemic protocol Continue Semglee 10 units nightly (patient takes Novolin 70/30 20 units in the morning and 60 units in the evening at home) and adjust dose accordingly  Bilateral leg weakness possibly related to patient's history of DDD Continue gabapentin Continue fall precaution and neurochecks Continue PT/OT eval and treat  Essential hypertension Continue amlodipine  Mixed hyperlipidemia Statin was discontinued by provider per med rec  GERD Continue Protonix  Anemia Continue Vitamin B12, ferrous sulfate per home regimen  Depression Continue Wellbutrin, Prozac  DVT prophylaxis: Lovenox  Code Status: Full code  Family Communication: Husband at bedside (all questions answered to satisfaction)  Disposition Plan:  Patient is  from:                        home Anticipated DC to:                   SNF or family members home Anticipated DC date:               1-2 days Anticipated DC barriers:          Patient requires inpatient management due to atypical chest discomfort pending cardiology consult  Consults called: Cardiology (Dr. Nehemiah Massed)  Admission status: Observation    Bernadette Hoit MD [facility]  06/23/2021, 5:31 PM

## 2021-06-23 NOTE — ED Triage Notes (Signed)
Pt over from [facility] with c/o irregular HR. Pt reports has felt like her heart  has been beating funny since Friday. Pt reports has some congestion in her chest for a couple days prior.

## 2021-06-24 ENCOUNTER — Observation Stay: Payer: Medicare Other

## 2021-06-24 DIAGNOSIS — R0789 Other chest pain: Secondary | ICD-10-CM | POA: Diagnosis not present

## 2021-06-24 LAB — CBC
HCT: 29.2 % — ABNORMAL LOW (ref 36.0–46.0)
Hemoglobin: 9.8 g/dL — ABNORMAL LOW (ref 12.0–15.0)
MCH: 31 pg (ref 26.0–34.0)
MCHC: 33.6 g/dL (ref 30.0–36.0)
MCV: 92.4 fL (ref 80.0–100.0)
Platelets: 157 10*3/uL (ref 150–400)
RBC: 3.16 MIL/uL — ABNORMAL LOW (ref 3.87–5.11)
RDW: 13.2 % (ref 11.5–15.5)
WBC: 6.1 10*3/uL (ref 4.0–10.5)
nRBC: 0 % (ref 0.0–0.2)

## 2021-06-24 LAB — COMPREHENSIVE METABOLIC PANEL
ALT: 44 U/L (ref 0–44)
AST: 39 U/L (ref 15–41)
Albumin: 3.4 g/dL — ABNORMAL LOW (ref 3.5–5.0)
Alkaline Phosphatase: 58 U/L (ref 38–126)
Anion gap: 8 (ref 5–15)
BUN: 18 mg/dL (ref 8–23)
CO2: 26 mmol/L (ref 22–32)
Calcium: 8.6 mg/dL — ABNORMAL LOW (ref 8.9–10.3)
Chloride: 101 mmol/L (ref 98–111)
Creatinine, Ser: 0.63 mg/dL (ref 0.44–1.00)
GFR, Estimated: >60 mL/min (ref >60)
Glucose, Bld: 224 mg/dL — ABNORMAL HIGH (ref 70–99)
Potassium: 4.2 mmol/L (ref 3.5–5.1)
Sodium: 135 mmol/L (ref 135–145)
Total Bilirubin: 0.3 mg/dL (ref 0.3–1.2)
Total Protein: 6.2 g/dL — ABNORMAL LOW (ref 6.5–8.1)

## 2021-06-24 LAB — NM MYOCAR MULTI W/SPECT W/WALL MOTION / EF
Estimated workload: 1
Exercise duration (min): 1 min
Exercise duration (sec): 8 s
LV dias vol: 72 mL (ref 46–106)
LV sys vol: 19 mL
Nuc Stress EF: 74 %
Peak HR: 110 {beats}/min
Percent HR: 74 %
Rest HR: 84 {beats}/min
Rest Nuclear Isotope Dose: 10.3 mCi
SDS: 5
SRS: 5
SSS: 1
ST Depression (mm): 0 mm
Stress Nuclear Isotope Dose: 28.7 mCi
TID: 0.93

## 2021-06-24 LAB — PHOSPHORUS: Phosphorus: 2.7 mg/dL (ref 2.5–4.6)

## 2021-06-24 LAB — APTT: aPTT: 28 seconds (ref 24–36)

## 2021-06-24 LAB — MAGNESIUM: Magnesium: 1.9 mg/dL (ref 1.7–2.4)

## 2021-06-24 LAB — CBG MONITORING, ED: Glucose-Capillary: 219 mg/dL — ABNORMAL HIGH (ref 70–99)

## 2021-06-24 MED ORDER — BUPROPION HCL ER (XL) 150 MG PO TB24: 150.0000 mg | ORAL_TABLET | Freq: Every day | ORAL | Status: DC

## 2021-06-24 MED ORDER — TECHNETIUM TC 99M TETROFOSMIN IV KIT
30.0000 | PACK | Freq: Once | INTRAVENOUS | Status: AC
  Administered 2021-06-24: 28.71 via INTRAVENOUS
  Filled 2021-06-24: qty 30

## 2021-06-24 MED ORDER — VITAMIN B-12 1000 MCG PO TABS
1000.0000 ug | ORAL_TABLET | Freq: Every day | ORAL | Status: DC
  Administered 2021-06-24: 1000 ug via ORAL
  Filled 2021-06-24: qty 1

## 2021-06-24 MED ORDER — FLUOXETINE HCL 20 MG PO CAPS
40.0000 mg | ORAL_CAPSULE | Freq: Every day | ORAL | Status: DC
Start: 2021-06-24 — End: 2021-06-24
  Administered 2021-06-24: 40 mg via ORAL
  Filled 2021-06-24: qty 2

## 2021-06-24 MED ORDER — AMLODIPINE BESYLATE 5 MG PO TABS
5.0000 mg | ORAL_TABLET | Freq: Every day | ORAL | Status: DC
Start: 2021-06-24 — End: 2021-06-24
  Administered 2021-06-24: 5 mg via ORAL
  Filled 2021-06-24: qty 1

## 2021-06-24 MED ORDER — REGADENOSON 0.4 MG/5ML IV SOLN
0.4000 mg | Freq: Once | INTRAVENOUS | Status: AC
  Administered 2021-06-24: 0.4 mg via INTRAVENOUS

## 2021-06-24 MED ORDER — GABAPENTIN 100 MG PO CAPS: 100.0000 mg | ORAL_CAPSULE | Freq: Every day | ORAL | Status: DC

## 2021-06-24 MED ORDER — TECHNETIUM TC 99M TETROFOSMIN IV KIT
10.2900 | PACK | Freq: Once | INTRAVENOUS | Status: AC | PRN
  Administered 2021-06-24: 10.29 via INTRAVENOUS
  Filled 2021-06-24: qty 11

## 2021-06-24 MED ORDER — PANTOPRAZOLE SODIUM 40 MG PO TBEC
40.0000 mg | DELAYED_RELEASE_TABLET | Freq: Every day | ORAL | Status: DC
  Administered 2021-06-24: 40 mg via ORAL
  Filled 2021-06-24: qty 1

## 2021-06-24 MED ORDER — FERROUS SULFATE 325 (65 FE) MG PO TABS
325.0000 mg | ORAL_TABLET | Freq: Every day | ORAL | Status: DC
  Administered 2021-06-24: 325 mg via ORAL
  Filled 2021-06-24: qty 1

## 2021-06-24 NOTE — Consult Note (Signed)
CARDIOLOGY CONSULT NOTE               Patient ID: Monica Rice MRN: [id number] DOB/AGE: [birth year] 73 y.o.  Admit date: 06/23/2021 Referring Physician Josephine Cables Primary Physician Mariana Arn Primary Cardiologist Nehemiah Massed Reason for Consultation chest pain  HPI: The patient is a 71 year old female with a past medical history significant for hypertension, hyperlipidemia, abdominal aorta atherosclerosis, type 2 diabetes, OSA, GERD, degenerative disc disease, glaucoma who presented to [facility] ED for chest discomfort x2 days.  Cardiology is consulted for further evaluation.  Patient reports 3 days ago she had 1 episode of chest tightness/heaviness at rest that she thought was related to acid reflux. She took an OTC acid reflux medication and the sensation eased off. She also reports intermittent "squiggly feeling" or fluttering in her chest that occurs whenever she gets up and moves around and generalized weakness in her lower legs that is new for her. She states she is normally able to take care of her 77 year old grandson and go to the park or out to restaurants without difficulty. She also is getting over the flu/residual cough and cold like symptoms for the past ~6 weeks. Admits to one transient episode of lightheadedness, but denies syncope. She has been taking her normal medications like she should, but has also added dayquil and nyquil recently and wonders if they could be contributing to her current symptoms.   Blood pressure initially was 150/86, overnight but remained mostly in the [id number] systolic. Labs on admission are significant for a creatinine of 0.63, EGFR greater than 60.  Troponins trended 5-<2.  H&H on admission 10.6/32.3, downtrending to 9.8/29.2 today.  Review of systems complete and found to be negative unless listed above   Past Medical History:  Diagnosis Date   Abdominal hernia    repaired   Anemia    Anxiety    Cancer ([place])    skin ca   Cervicalgia    Chronic  tension headaches    Complication of anesthesia    "difficult to wake up"   Cortical senile cataract    DDD (degenerative disc disease), lumbar    DDD (degenerative disc disease), lumbar    Depression    Diabetes mellitus without complication ([place])    Difficult intubation    Fatty liver    GERD (gastroesophageal reflux disease)    Glaucoma    H/O scarlet fever    Hyperlipidemia    Hypersomnia with sleep apnea    Hypertension    Lumbago    Osteoarthrosis    Peripheral vascular disease (HCC)    Seasonal allergies    Sleep apnea     Past Surgical History:  Procedure Laterality Date   APPENDECTOMY     CARPAL TUNNEL RELEASE Bilateral    CATARACT EXTRACTION, BILATERAL     CESAREAN SECTION     x2    CHOLECYSTECTOMY     COLONOSCOPY     COLONOSCOPY WITH PROPOFOL N/A 01/25/2018   Procedure: COLONOSCOPY WITH PROPOFOL;  Surgeon: Manya Silvas, MD;  Location: [facility] ENDOSCOPY;  Service: Endoscopy;  Laterality: N/A;   COLONOSCOPY WITH PROPOFOL N/A 01/04/2021   Procedure: COLONOSCOPY WITH PROPOFOL;  Surgeon: Lesly Rubenstein, MD;  Location: [facility] ENDOSCOPY;  Service: Endoscopy;  Laterality: N/A;  DM   DORSAL COMPARTMENT RELEASE Left 01/17/2019   Procedure: RELEASE DORSAL COMPARTMENT (DEQUERVAIN);  Surgeon: Dereck Leep, MD;  Location: [facility] ORS;  Service: Orthopedics;  Laterality: Left;   ESOPHAGOGASTRODUODENOSCOPY  EYE SURGERY     GANGLION CYST EXCISION Left 01/17/2019   Procedure: REMOVAL GANGLION OF WRIST;  Surgeon: Dereck Leep, MD;  Location: [facility] ORS;  Service: Orthopedics;  Laterality: Left;   HERNIA REPAIR     JOINT REPLACEMENT Bilateral    LAPAROSCOPY N/A 01/25/2016   Procedure: LAPAROSCOPY DIAGNOSTIC;  Surgeon: Jules Husbands, MD;  Location: [facility] ORS;  Service: General;  Laterality: N/A;   LAPAROTOMY N/A 01/25/2016   Procedure: EXPLORATORY LAPAROTOMY;  Surgeon: Jules Husbands, MD;  Location: [facility] ORS;  Service: General;  Laterality: N/A;   TOTAL HIP ARTHROPLASTY Left  03/11/2017   Procedure: TOTAL HIP ARTHROPLASTY;  Surgeon: Dereck Leep, MD;  Location: [facility] ORS;  Service: Orthopedics;  Laterality: Left;   tumor rt foot Right    varicose      (Not in a hospital admission)  Social History   Socioeconomic History   Marital status: Married    Spouse name: luther   Number of children: 2   Years of education: 12   Highest education level: Not on file  Occupational History   Not on file  Tobacco Use   Smoking status: Never   Smokeless tobacco: Never  Vaping Use   Vaping Use: Never used  Substance and Sexual Activity   Alcohol use: No   Drug use: No   Sexual activity: Yes    Comment: female  Other Topics Concern   Not on file  Social History Narrative   Not on file   Social Determinants of Health   Financial Resource Strain: Not on file  Food Insecurity: Not on file  Transportation Needs: Not on file  Physical Activity: Not on file  Stress: Not on file  Social Connections: Not on file  Intimate Partner Violence: Not on file    Family History  Problem Relation Age of Onset   Hypertension Mother    COPD Mother    Hyperlipidemia Mother    Heart disease Mother    Rheum arthritis Mother    Kidney disease Father    Heart disease Father    Kidney failure Father    Hyperlipidemia Sister    Hypertension Sister    Skin cancer Sister    Colon polyps Sister    Breast cancer Neg Hx       Review of systems complete and found to be negative unless listed above    PHYSICAL EXAM General: elderly appearing caucasian female, well nourished, in no acute distress. Laying at incline in ED bed, husband at bedside.  HEENT:  Normocephalic and atraumatic. Neck:  No JVD.  Lungs: Normal respiratory effort on room air. Clear bilaterally to auscultation. No wheezes, crackles, rhonchi.  Heart: HRRR . Normal S1 and S2 without gallops or murmurs. Radial & DP pulses 2+ bilaterally. Abdomen: non-disdended appearing.  Msk: Normal strength and tone for  age. Extremities: No clubbing, cyanosis or edema.   Neuro: Alert and oriented X 3. Psych:  Mood appropriate, affect congruent.   Labs:   Lab Results  Component Value Date   WBC 6.1 06/24/2021   HGB 9.8 (L) 06/24/2021   HCT 29.2 (L) 06/24/2021   MCV 92.4 06/24/2021   PLT 157 06/24/2021    Recent Labs  Lab 06/24/21 0625  NA 135  K 4.2  CL 101  CO2 26  BUN 18  CREATININE 0.63  CALCIUM 8.6*  PROT 6.2*  BILITOT 0.3  ALKPHOS 58  ALT 44  AST 39  GLUCOSE 224*  Lab Results  Component Value Date   CKTOTAL 117 02/23/2014   CKMB 3.8 (H) 02/23/2014   TROPONINI < 0.02 02/23/2014   No results found for: CHOL No results found for: HDL No results found for: LDLCALC No results found for: TRIG No results found for: CHOLHDL No results found for: LDLDIRECT    Radiology: DG Chest 2 View  Result Date: 06/23/2021 CLINICAL DATA:  Irregular heart rate EXAM: CHEST - 2 VIEW COMPARISON:  Chest x-ray 06/04/2018 FINDINGS: Heart size and mediastinal contours are within normal limits. No suspicious pulmonary opacities identified. No pleural effusion or pneumothorax visualized. No acute osseous abnormality appreciated. IMPRESSION: No acute intrathoracic process identified. Electronically Signed   By: Ofilia Neas M.D.   On: 06/23/2021 14:13    ECHO no prior  TELEMETRY reviewed by me: NSR rate 85-94 during interview  EKG reviewed by me: Normal sinus rhythm rate of 96, poor R wave progression when compared to EKG from 2020.  ASSESSMENT AND PLAN:  The patient is a 71 year old female with a past medical history significant for hypertension, hyperlipidemia, abdominal aorta atherosclerosis, type 2 diabetes, OSA, GERD, degenerative disc disease, glaucoma who presented to [facility] ED for chest discomfort x2 days.  Cardiology is consulted for further evaluation.  #atypical chest pain The patient reported one episode of chest pressure/tightness and intermittent "squiggly" or fluttering sensation in  her chest and generalized weakness over the past 3 days. She reports the sensation in her chest subsided with taking an OTC acid reflux medication. She has risk factors for CAD including HTN, HLD, and diabetes and EKG shows some poor R wave progression that is new compared to two years ago.  We will consider further cardiac work-up based on results of Lexiscan Myoview. -Troponins trended 5-<2. -S/p aspirin 324 mg, continue baby aspirin indefinitely. -recommend lexiscan myoview to be performed today.  -ordered echocardiogram for further assessment of heart structure and function. -recommend starting low dose beta blocker before discharge   #Hypertension #Hyperlipidemia -Continue aspirin, high intensity statin therapy with atorvastatin 40. -On amlodipine 5-HCTZ 25 at home.   #type 2 diabetes -SSI per primary team  This patient's plan of care was discussed and created with Dr. Lujean Amel and he is in agreement.  Signed: Tristan Schroeder , PA-C 06/24/2021, 9:14 AM

## 2021-06-24 NOTE — Discharge Summary (Signed)
Physician Discharge Summary  Monica Rice:[id number] DOB: [birth year] DOA: 06/23/2021  PCP: Tracie Harrier, MD  Admit date: 06/23/2021 Discharge date: 06/24/2021  Discharge disposition: Home   Recommendations for Outpatient Follow-Up:   Follow-up with Dr. Nehemiah Massed, cardiologist in 1 week Follow-up with PCP in 1 week   Discharge Diagnosis:   Principal Problem:   Atypical chest pain Active Problems:   Benign essential hypertension   DDD (degenerative disc disease), lumbar   Mixed hyperlipidemia   Hyperglycemia due to diabetes mellitus ([place])   GERD (gastroesophageal reflux disease)   Glaucoma   Leg weakness, bilateral    Discharge Condition: Stable.  Diet recommendation:  Diet Order             Diet heart healthy/carb modified Room service appropriate? Yes; Fluid consistency: Thin  Diet effective now           Diet - low sodium heart healthy           Diet Carb Modified                     Code Status: Full Code     Hospital Course:   Monica Rice is a 71 year old woman with medical history significant for hypertension, hyperlipidemia, type II DM, GERD, glucoma, degenerative disc disease, who presented to the hospital because of chest discomfort, palpitations with exertion and leg weakness.  She was admitted to telemetry for observation.  Serial troponins were negative.  Because of multiple cardiovascular risk factors, she underwent nuclear stress test which was unremarkable.  Her symptoms have resolved and she is deemed stable for discharge to home today.  Discharge plan was discussed with Dr. Clayborn Bigness, cardiologist, and from my standpoint, patient is okay for discharge.  Discharge plan was discussed with patient her husband at the bedside.    Medical Consultants:   Cardiologist   Discharge Exam:    Vitals:   06/24/21 0500 06/24/21 0605 06/24/21 1000 06/24/21 1600  BP: 134/77 140/86 126/75 138/80  Pulse: 80 82 (!) 102 90  Resp: 13 15  (!) 22 17  Temp:   98.5 F (36.9 C) 98.6 F (37 C)  TempSrc:   Oral Oral  SpO2: 93% 97% 97% 92%  Weight:      Height:         GEN: NAD SKIN: No ra EYES: EOMI ENT: MMM CV: RRR PULM: CTA B ABD: soft, obese, NT, +BS CNS: AAO x 3, non focal EXT: No edema or tenderness   The results of significant diagnostics from this hospitalization (including imaging, microbiology, ancillary and laboratory) are listed below for reference.     Procedures and Diagnostic Studies:   DG Chest 2 View  Result Date: 06/23/2021 CLINICAL DATA:  Irregular heart rate EXAM: CHEST - 2 VIEW COMPARISON:  Chest x-ray 06/04/2018 FINDINGS: Heart size and mediastinal contours are within normal limits. No suspicious pulmonary opacities identified. No pleural effusion or pneumothorax visualized. No acute osseous abnormality appreciated. IMPRESSION: No acute intrathoracic process identified. Electronically Signed   By: Ofilia Neas M.D.   On: 06/23/2021 14:13   NM Myocar Multi W/Spect W/Wall Motion / EF  Result Date: 06/24/2021   The study is normal. The study is low risk.   No ST deviation was noted.   LV perfusion is normal. There is no evidence of ischemia. There is no evidence of infarction.   Left ventricular function is normal. Nuclear stress EF: 74 %. The left ventricular ejection fraction is hyperdynamic (>  60%). End diastolic cavity size is normal. End systolic cavity size is normal.   Prior study not available for comparison. Normal myocardial perfusion scan No evidence of stress-induced ischemia Normal left ventricular function 74% This is a low risk scan     Labs:   Basic Metabolic Panel: Recent Labs  Lab 06/23/21 1410 06/24/21 0625  NA 138 135  K 3.9 4.2  CL 103 101  CO2 27 26  GLUCOSE 271* 224*  BUN 19 18  CREATININE 0.71 0.63  CALCIUM 9.0 8.6*  MG 2.0 1.9  PHOS  --  2.7   GFR Estimated Creatinine Clearance: 67 mL/min (by C-G formula based on SCr of 0.63 mg/dL). Liver Function  Tests: Recent Labs  Lab 06/24/21 0625  AST 39  ALT 44  ALKPHOS 58  BILITOT 0.3  PROT 6.2*  ALBUMIN 3.4*   No results for input(s): LIPASE, AMYLASE in the last 168 hours. No results for input(s): AMMONIA in the last 168 hours. Coagulation profile No results for input(s): INR, PROTIME in the last 168 hours.  CBC: Recent Labs  Lab 06/23/21 1410 06/24/21 0625  WBC 7.3 6.1  HGB 10.6* 9.8*  HCT 32.6* 29.2*  MCV 91.1 92.4  PLT 209 157   Cardiac Enzymes: No results for input(s): CKTOTAL, CKMB, CKMBINDEX, TROPONINI in the last 168 hours. BNP: Invalid input(s): POCBNP CBG: Recent Labs  Lab 06/23/21 2206 06/24/21 0908  GLUCAP 283* 219*   D-Dimer No results for input(s): DDIMER in the last 72 hours. Hgb A1c No results for input(s): HGBA1C in the last 72 hours. Lipid Profile No results for input(s): CHOL, HDL, LDLCALC, TRIG, CHOLHDL, LDLDIRECT in the last 72 hours. Thyroid function studies No results for input(s): TSH, T4TOTAL, T3FREE, THYROIDAB in the last 72 hours.  Invalid input(s): FREET3 Anemia work up No results for input(s): VITAMINB12, FOLATE, FERRITIN, TIBC, IRON, RETICCTPCT in the last 72 hours. Microbiology No results found for this or any previous visit (from the past 240 hour(s)).   Discharge Instructions:   Discharge Instructions     Diet - low sodium heart healthy   Complete by: As directed    Diet Carb Modified   Complete by: As directed    Increase activity slowly   Complete by: As directed       Allergies as of 06/24/2021       Reactions   Hydrocodone Other (See Comments)   Hallucinations   Septra [sulfamethoxazole-trimethoprim] Other (See Comments)   Chest pain, headache and rash   Neomycin-bacitracin-polymyxin [bacitracin-neomycin-polymyxin] Rash, Other (See Comments)   Increases infection.   Sulfa Antibiotics Rash, Other (See Comments)   Chest pains, headache and rash        Medication List     STOP taking these medications     atorvastatin 20 MG tablet Commonly known as: LIPITOR   cholestyramine 4 g packet Commonly known as: QUESTRAN   colestipol 1 g tablet Commonly known as: COLESTID   fluticasone 50 MCG/ACT nasal spray Commonly known as: FLONASE   HAIR/SKIN/NAILS PO   traMADol 50 MG tablet Commonly known as: Ultram       TAKE these medications    Accu-Chek Aviva Plus test strip Generic drug: glucose blood   glucose blood test strip Use 3 (three) times daily. Use as instructed.   amLODipine 5 MG tablet Commonly known as: NORVASC Take 5 mg by mouth daily.   aspirin 81 MG EC tablet Take 81 mg by mouth at bedtime. Swallow whole.   buPROPion  150 MG 24 hr tablet Commonly known as: WELLBUTRIN XL Take 150 mg by mouth at bedtime.   CALCIUM 600 + D PO Take 1 tablet by mouth daily.   clonazePAM 0.5 MG tablet Commonly known as: KLONOPIN Take 0.5 mg by mouth daily as needed for anxiety.   Dulaglutide 0.75 MG/0.5ML Sopn Inject 0.75 mg into the skin every Monday.   ferrous sulfate 325 (65 FE) MG tablet Take 325 mg by mouth daily.   FLUoxetine 20 MG capsule Commonly known as: PROZAC Take 40 mg by mouth daily.   gabapentin 100 MG capsule Commonly known as: NEURONTIN Take 100 mg by mouth at bedtime.   hydrochlorothiazide 25 MG tablet Commonly known as: HYDRODIURIL Take 25 mg by mouth daily.   insulin NPH-regular Human (70-30) 100 UNIT/ML injection Inject 20-70 Units into the skin 2 (two) times daily before a meal. Take 20 units in the morning and 60 units in the evening.   latanoprost 0.005 % ophthalmic solution Commonly known as: XALATAN Place 1 drop into both eyes at bedtime.   Magnesium 250 MG Tabs Take by mouth daily.   metFORMIN 1000 MG tablet Commonly known as: GLUCOPHAGE Take 1,000 mg by mouth 2 (two) times daily.   mometasone 0.1 % lotion Commonly known as: ELOCON Apply 2-3 drops topically daily as needed (for ear itching due to dermatitis).   multivitamin with  minerals Tabs tablet Take 1 tablet by mouth daily. Centrum Silver   omeprazole 20 MG capsule Commonly known as: PRILOSEC Take 20 mg by mouth 2 (two) times daily.   vitamin B-12 1000 MCG tablet Commonly known as: CYANOCOBALAMIN Take 1,000 mcg by mouth daily.   zinc gluconate 50 MG tablet Take 50 mg by mouth daily.        Follow-up Information     Corey Skains, MD. Schedule an appointment as soon as possible for a visit in 1 week(s).   Specialty: Cardiology Contact information: [address] [facility] [place] [address] [phone]                   If you experience worsening of your admission symptoms, develop shortness of breath, life threatening emergency, suicidal or homicidal thoughts you must seek medical attention immediately by calling 911 or calling your MD immediately  if symptoms less severe.   You must read complete instructions/literature along with all the possible adverse reactions/side effects for all the medicines you take and that have been prescribed to you. Take any new medicines after you have completely understood and accept all the possible adverse reactions/side effects.    Please note   You were cared for by a hospitalist during your hospital stay. If you have any questions about your discharge medications or the care you received while you were in the hospital after you are discharged, you can call the unit and asked to speak with the hospitalist on call if the hospitalist that took care of you is not available. Once you are discharged, your primary care physician will handle any further medical issues. Please note that NO REFILLS for any discharge medications will be authorized once you are discharged, as it is imperative that you return to your primary care physician (or establish a relationship with a primary care physician if you do not have one) for your aftercare needs so that they can reassess your need  for medications and monitor your lab values.       Time coordinating discharge: 34 minutes  Signed:  Jennye Boroughs  [facility] 06/24/2021, 5:12 PM   Pager on [email or web address]. If 7PM-7AM, please contact night-coverage at [email or web address]

## 2021-06-24 NOTE — Consult Note (Signed)
Brief Cardiology Consult Note  Imp CP Possible Angina DM HTN Obesity GERD Hyperlipidemia Palpitations PVD OSA . Plan Myoview today Echo today Continue ASA Consider NTG paste/Imdur ARB/ACE B-Blocker Statin DM meds Omeprazole for GERD CPAP for OSA   Full Consult Note to Follow [facility]

## 2021-06-24 NOTE — Care Management Obs Status (Signed)
[place] NOTIFICATION   Patient Details  Name: JACQUELINA HEWINS MRN: [id number] Date of Birth: [birth year]   Medicare Observation Status Notification Given:  Yes    Shelbie Hutching, RN 06/24/2021, 2:33 PM

## 2021-06-24 NOTE — TOC Initial Note (Signed)
Transition of Care [facility]) - Initial/Assessment Note    Patient Details  Name: Monica Rice MRN: [id number] Date of Birth: 1951-01-07  Transition of Care [facility]) CM/SW Contact:    Shelbie Hutching, RN Phone Number: 06/24/2021, 2:38 PM  Clinical Narrative:                  Transition of Care [facility]) Screening Note   Patient Details  Name: Monica Rice Date of Birth: 1950/08/27   Transition of [facility]) CM/SW Contact:    Shelbie Hutching, RN Phone Number: 06/24/2021, 2:38 PM    Transition of Care [facility]) has reviewed patient and no TOC needs have been identified at this time. We will continue to monitor patient advancement through interdisciplinary progression rounds. If new patient transition needs arise, please place a TOC consult.          Patient Goals and CMS Choice        Expected Discharge Plan and Services                                                Prior Living Arrangements/Services                       Activities of Daily Living      Permission Sought/Granted                  Emotional Assessment              Admission diagnosis:  Atypical chest pain [R07.89] Patient Active Problem List   Diagnosis Date Noted   Atypical chest pain 06/23/2021   Hyperglycemia due to diabetes mellitus ([place]) 06/23/2021   GERD (gastroesophageal reflux disease) 06/23/2021   Glaucoma 06/23/2021   Leg weakness, bilateral 06/23/2021   [place] Must Quervain's disease (radial styloid tenosynovitis) 11/22/2018   Ganglion of left wrist 11/22/2018   Atherosclerosis of abdominal aorta ([place]) 04/28/2018   Status post total replacement of left hip 03/11/2017   Depression 01/30/2016   Diabetes mellitus type 2, uncomplicated ([place]) 88/41/6606   Mixed hyperlipidemia 01/30/2016   Sleep apnea 01/30/2016   Status post total replacement of right hip 06/26/2015   Chronic midline low back pain with bilateral sciatica 11/02/2014   Bilateral  carotid artery stenosis 10/04/2014   Benign essential hypertension 09/21/2014   DDD (degenerative disc disease), lumbar 12/28/2013   Lumbar radiculitis 12/09/2013   PCP:  Tracie Harrier, MD Pharmacy:   [facility] [place] [address] [place] [address] Phone: [phone] Fax: [phone]     Social Determinants of Health (SDOH) Interventions    Readmission Risk Interventions No flowsheet data found.

## 2021-06-24 NOTE — Progress Notes (Signed)
Inpatient Diabetes Program Recommendations  AACE/ADA: New Consensus Statement on Inpatient Glycemic Control   Target Ranges:  Prepandial:   less than 140 mg/dL      Peak postprandial:   less than 180 mg/dL (1-2 hours)      Critically ill patients:  140 - 180 mg/dL     Latest Reference Range & Units 06/23/21 22:06 06/24/21 09:08  Glucose-Capillary 70 - 99 mg/dL 283 (H) 219 (H)   Review of Glycemic Control  Diabetes history: DM2 Outpatient Diabetes medications: 70/30 22 units QAM, 70/30 62 units QPM, Metformin 4847 mg BID, Trulicity 2.07 mg Qweek (Monday) Current orders for Inpatient glycemic control: Semglee 10 units QHS, Novolog 0-15 units TID with meals, Novolog 0-5 units QHS  Inpatient Diabetes Program Recommendations:    Insulin: Please consider increasing Semglee to 14 units QHS. Once diet is resumed, may need to order meal coverage insulin if post prandial glucose is consistently elevated.  Thanks, Barnie Alderman, RN, MSN, CDE Diabetes Coordinator Inpatient Diabetes Program [phone] (Team Pager from 8am to 5pm)

## 2021-06-24 NOTE — ED Notes (Signed)
Patient moved to C-pod, report given to [place], [place]

## 2021-06-24 NOTE — ED Notes (Signed)
Unhooked pt so that she could use the restroom

## 2021-06-25 LAB — HEMOGLOBIN A1C
Hgb A1c MFr Bld: 7.8 % — ABNORMAL HIGH (ref 4.8–5.6)
Mean Plasma Glucose: 177 mg/dL

## 2021-08-21 ENCOUNTER — Ambulatory Visit
Admission: RE | Admit: 2021-08-21 | Discharge: 2021-08-21 | Disposition: A | Discharge status: Home / Self Care | Payer: Medicare Other | Source: Ambulatory Visit | Attending: Internal Medicine | Admitting: Internal Medicine

## 2021-08-21 ENCOUNTER — Other Ambulatory Visit: Payer: Self-pay

## 2021-08-21 DIAGNOSIS — Z1231 Encounter for screening mammogram for malignant neoplasm of breast: Secondary | ICD-10-CM | POA: Diagnosis present

## 2021-09-05 ENCOUNTER — Other Ambulatory Visit (HOSPITAL_COMMUNITY): Payer: Self-pay | Admitting: Orthopedic Surgery

## 2021-09-05 ENCOUNTER — Other Ambulatory Visit: Payer: Self-pay | Admitting: Orthopedic Surgery

## 2021-09-05 DIAGNOSIS — S42255D Nondisplaced fracture of greater tuberosity of left humerus, subsequent encounter for fracture with routine healing: Secondary | ICD-10-CM

## 2021-09-05 DIAGNOSIS — M25512 Pain in left shoulder: Secondary | ICD-10-CM

## 2021-09-21 ENCOUNTER — Ambulatory Visit
Admission: RE | Admit: 2021-09-21 | Discharge: 2021-09-21 | Disposition: A | Discharge status: Home / Self Care | Payer: Medicare Other | Source: Ambulatory Visit | Attending: Orthopedic Surgery | Admitting: Orthopedic Surgery

## 2021-09-21 DIAGNOSIS — M25512 Pain in left shoulder: Secondary | ICD-10-CM | POA: Insufficient documentation

## 2021-09-21 DIAGNOSIS — S42255D Nondisplaced fracture of greater tuberosity of left humerus, subsequent encounter for fracture with routine healing: Secondary | ICD-10-CM | POA: Insufficient documentation

## 2022-02-05 ENCOUNTER — Inpatient Hospital Stay: Payer: Medicare Other

## 2022-02-05 ENCOUNTER — Encounter: Payer: Self-pay | Admitting: Licensed Clinical Social Worker

## 2022-02-05 ENCOUNTER — Inpatient Hospital Stay: Payer: Medicare Other | Attending: Oncology | Admitting: Licensed Clinical Social Worker

## 2022-02-05 DIAGNOSIS — Z803 Family history of malignant neoplasm of breast: Secondary | ICD-10-CM | POA: Diagnosis not present

## 2022-02-05 DIAGNOSIS — Z86010 Personal history of colonic polyps: Secondary | ICD-10-CM | POA: Diagnosis not present

## 2022-02-05 NOTE — Progress Notes (Signed)
REFERRING PROVIDER: Ok Edwards, NP [place],  [place] [address]  PRIMARY PROVIDER:  Tracie Harrier, MD  PRIMARY REASON FOR VISIT:  1. Personal history of colonic polyps   2. Family history of breast cancer      HISTORY OF PRESENT ILLNESS:   Monica Rice, a 71 y.o. female, was seen for a [facility] cancer genetics consultation at the request of Dr. Jacqulyn Liner due to a personal history of colon polyps.  Monica Rice presents to clinic today to discuss the possibility of a hereditary predisposition to cancer, genetic testing, and to further clarify her future cancer risks, as well as potential cancer risks for family members.    CANCER HISTORY:  Monica Rice is a 71 y.o. female with no personal history of cancer.    RISK FACTORS:  Ovaries intact: yes.  Hysterectomy: no.  Menopausal status: postmenopausal.  HRT use: 0 years. Colonoscopy: yes;  2022- 10 adenomas, 2019- 2 adenomas, 2014- 1 adenoma . Mammogram within the last year: yes. Number of breast biopsies: 0.  Past Medical History:  Diagnosis Date   Abdominal hernia    repaired   Anemia    Anxiety    Cancer ([place])    skin ca   Cervicalgia    Chronic tension headaches    Complication of anesthesia    "difficult to wake up"   Cortical senile cataract    DDD (degenerative disc disease), lumbar    DDD (degenerative disc disease), lumbar    Depression    Diabetes mellitus without complication ([place])    Difficult intubation    Fatty liver    GERD (gastroesophageal reflux disease)    Glaucoma    H/O scarlet fever    Hyperlipidemia    Hypersomnia with sleep apnea    Hypertension    Lumbago    Osteoarthrosis    Peripheral vascular disease (H[facility])    Seasonal allergies    Sleep apnea     Past Surgical History:  Procedure Laterality Date   APPENDECTOMY     CARPAL TUNNEL RELEASE Bilateral    CATARACT EXTRACTION, BILATERAL     CESAREAN SECTION     x2     CHOLECYSTECTOMY     COLONOSCOPY     COLONOSCOPY WITH PROPOFOL N/A 01/25/2018   Procedure: COLONOSCOPY WITH PROPOFOL;  Surgeon: Manya Silvas, MD;  Location: [facility] ENDOSCOPY;  Service: Endoscopy;  Laterality: N/A;   COLONOSCOPY WITH PROPOFOL N/A 01/04/2021   Procedure: COLONOSCOPY WITH PROPOFOL;  Surgeon: Lesly Rubenstein, MD;  Location: [facility] ENDOSCOPY;  Service: Endoscopy;  Laterality: N/A;  DM   DORSAL COMPARTMENT RELEASE Left 01/17/2019   Procedure: RELEASE DORSAL COMPARTMENT (DEQUERVAIN);  Surgeon: Dereck Leep, MD;  Location: [facility] ORS;  Service: Orthopedics;  Laterality: Left;   ESOPHAGOGASTRODUODENOSCOPY     EYE SURGERY     GANGLION CYST EXCISION Left 01/17/2019   Procedure: REMOVAL GANGLION OF WRIST;  Surgeon: Dereck Leep, MD;  Location: [facility] ORS;  Service: Orthopedics;  Laterality: Left;   HERNIA REPAIR     JOINT REPLACEMENT Bilateral    LAPAROSCOPY N/A 01/25/2016   Procedure: LAPAROSCOPY DIAGNOSTIC;  Surgeon: Jules Husbands, MD;  Location: [facility] ORS;  Service: General;  Laterality: N/A;   LAPAROTOMY N/A 01/25/2016   Procedure: EXPLORATORY LAPAROTOMY;  Surgeon: Jules Husbands, MD;  Location: [facility] ORS;  Service: General;  Laterality: N/A;   TOTAL HIP ARTHROPLASTY Left 03/11/2017   Procedure: TOTAL HIP ARTHROPLASTY;  Surgeon:  Hooten, Laurice Record, MD;  Location: [facility] ORS;  Service: Orthopedics;  Laterality: Left;   tumor rt foot Right    varicose      FAMILY HISTORY:  We obtained a detailed, 4-generation family history.  Significant diagnoses are listed below: Family History  Problem Relation Age of Onset   Hypertension Mother    COPD Mother    Hyperlipidemia Mother    Heart disease Mother    Rheum arthritis Mother    Kidney disease Father    Heart disease Father    Kidney failure Father    Hyperlipidemia Sister    Hypertension Sister    Skin cancer Sister    Colon polyps Sister    Breast cancer Sister 41   Monica Rice has 2 sons, 48 and 37. She has 1 full sister, 1  maternal half sister, 2 paternal half brothers. Her full sister has had skin cancer and breast cancer in her 13s. A paternal half brother had sinus cancer.   Monica Rice mother passed at 14. Her father passed at 23. No other known cancer in the family.  Monica Rice is unaware of previous family history of genetic testing for hereditary cancer risks. There is no reported Ashkenazi Jewish ancestry. There is no known consanguinity.     GENETIC COUNSELING ASSESSMENT: Monica Rice is a 71 y.o. female with a personal history of colon polyps which is somewhat suggestive of a hereditary polyposis syndrome and predisposition to cancer. We, therefore, discussed and recommended the following at today's visit.   DISCUSSION: We discussed that polyps in general are common, however, most people have fewer than 5 lifetime polyps.  When an individual has 10 or more polyps we become concerned about an underlying polyposis syndrome.  The most common hereditary polyposis syndromes are caused by problems in the APC and MUTYH genes. We discussed that testing is beneficial for several reasons including knowing how to follow individuals for cancer screenings, and understand if other family members could be at risk for cancer and allow them to undergo genetic testing.   We reviewed the characteristics, features and inheritance patterns of hereditary cancer syndromes. We also discussed genetic testing, including the appropriate family members to test, the process of testing, insurance coverage and turn-around-time for results. We discussed the implications of a negative, positive and/or variant of uncertain significant result. We recommended Monica Rice pursue genetic testing for the [facility] Multi-Cancer+RNA gene panel.   Based on Monica Rice personal history of colon polyps, she meets medical criteria for genetic testing. Despite that she meets criteria, she may still have an out of pocket cost. We discussed that if  her out of pocket cost for testing is over $100, the laboratory will call and confirm whether she wants to proceed with testing.  If the out of pocket cost of testing is less than $100 she will be billed by the genetic testing laboratory.   PLAN: After considering the risks, benefits, and limitations, Monica Rice provided informed consent to pursue genetic testing and the blood sample was sent to [facility] for analysis of the Multi-Cancer+RNA panel. Results should be available within approximately 2-3 weeks' time, at which point they will be disclosed by telephone to Monica Rice, as will any additional recommendations warranted by these results. Monica Rice will receive a summary of her genetic counseling visit and a copy of her results once available. This information will also be available in Epic.   Monica Rice questions were answered to her satisfaction today.  Our contact information was provided should additional questions or concerns arise. Thank you for the referral and allowing [place] to share in the care of your patient.   Faith Rogue, MS, [facility] Genetic Counselor [email or web address] Phone: [phone]  The patient was seen for a total of 25 minutes in face-to-face genetic counseling.  Patient's husband was also present. Dr. Grayland Ormond was available for discussion regarding this case.   _______________________________________________________________________ For Office Staff:  Number of people involved in session: 2 Was an Intern/ student involved with case: yes; [employer] intern Monica Rice was present and assisted with this case.

## 2022-03-05 ENCOUNTER — Encounter: Payer: Self-pay | Admitting: Licensed Clinical Social Worker

## 2022-03-05 ENCOUNTER — Ambulatory Visit: Payer: Self-pay | Admitting: Licensed Clinical Social Worker

## 2022-03-05 ENCOUNTER — Telehealth: Payer: Self-pay | Admitting: Licensed Clinical Social Worker

## 2022-03-05 DIAGNOSIS — Z1379 Encounter for other screening for genetic and chromosomal anomalies: Secondary | ICD-10-CM | POA: Insufficient documentation

## 2022-03-05 NOTE — Telephone Encounter (Signed)
I contacted Monica Rice to discuss her genetic testing results. No pathogenic variants were identified in the 84 genes analyzed. Detailed clinic note to follow.   The test report has been scanned into EPIC and is located under the Molecular Pathology section of the Results Review tab.  A portion of the result report is included below for reference.      Faith Rogue, MS, [facility] Genetic Counselor [email or web address] Phone: [phone]

## 2022-03-05 NOTE — Progress Notes (Signed)
HPI:   [name] was previously seen in the [place] clinic due to a personal history of colon polyps and family history of cancer, and concerns regarding a hereditary predisposition to cancer. Please refer to our prior cancer genetics clinic note for more information regarding our discussion, assessment and recommendations, at the time. [name] recent genetic test results were disclosed to her, as were recommendations warranted by these results. These results and recommendations are discussed in more detail below.  CANCER HISTORY:  Oncology History   No history exists.    FAMILY HISTORY:  We obtained a detailed, 4-generation family history.  Significant diagnoses are listed below: Family History  Problem Relation Age of Onset   Hypertension Mother    COPD Mother    Hyperlipidemia Mother    Heart disease Mother    Rheum arthritis Mother    Kidney disease Father    Heart disease Father    Kidney failure Father    Hyperlipidemia Sister    Hypertension Sister    Skin cancer Sister    Colon polyps Sister    Breast cancer Sister 79      [name] has 2 sons, 103 and 43. She has 1 full sister, 1 maternal half sister, 2 paternal half brothers. Her full sister has had skin cancer and breast cancer in her 53s. A paternal half brother had sinus cancer.    [name] mother passed at 43. Her father passed at 65. No other known cancer in the family.   [name] is unaware of previous family history of genetic testing for hereditary cancer risks. There is no reported Ashkenazi Jewish ancestry. There is no known consanguinity.       GENETIC TEST RESULTS:  The [facility] Multi-Cancer+RNA Panel found no pathogenic mutations.   The Multi-Cancer Panel + RNA offered by [facility] includes sequencing and/or deletion duplication testing of the following 84 genes: AIP, ALK, APC, ATM, AXIN2,BAP1,  BARD1, BLM, BMPR1A, BRCA1, BRCA2, BRIP1, CASR, CDC73, CDH1, CDK4,  CDKN1B, CDKN1C, CDKN2A (p14ARF), CDKN2A (p16INK4a), CEBPA, CHEK2, CTNNA1, DICER1, DIS3L2, EGFR (c.2369C>T, p.Thr790Met variant only), EPCAM (Deletion/duplication testing only), FH, FLCN, GATA2, GPC3, GREM1 (Promoter region deletion/duplication testing only), HOXB13 (c.251G>A, p.Gly84Glu), HRAS, KIT, MAX, MEN1, MET, MITF (c.952G>A, p.Glu318Lys variant only), MLH1, MSH2, MSH3, MSH6, MUTYH, NBN, NF1, NF2, NTHL1, PALB2, PDGFRA, PHOX2B, PMS2, POLD1, POLE, POT1, PRKAR1A, PTCH1, PTEN, RAD50, RAD51C, RAD51D, RB1, RECQL4, RET, RUNX1, SDHAF2, SDHA (sequence changes only), SDHB, SDHC, SDHD, SMAD4, SMARCA4, SMARCB1, SMARCE1, STK11, SUFU, TERC, TERT, TMEM127, TP53, TSC1, TSC2, VHL, WRN and WT1.   The test report has been scanned into EPIC and is located under the Molecular Pathology section of the Results Review tab.  A portion of the result report is included below for reference. Genetic testing reported out on 03/05/2022.      Even though a pathogenic variant was not identified, possible explanations for the cancer in the family may include: There may be no hereditary risk for cancer in the family. The cancers in her family may be sporadic/familial or due to other genetic and environmental factors. There may be a gene mutation in one of these genes that current testing methods cannot detect but that chance is small. There could be another gene that has not yet been discovered, or that we have not yet tested, that is responsible for the cancer diagnoses/polyps in the family.  It is also possible there is a hereditary cause for the cancer in the family that [name]  did not inherit.  Therefore, it is important to remain in touch with cancer genetics in the future so that we can continue to offer [name] the most up to date genetic testing.   ADDITIONAL GENETIC TESTING:  We discussed with [name] that her genetic testing was fairly extensive.  If there are additional relevant genes identified to  increase cancer risk that can be analyzed in the future, we would be happy to discuss and coordinate this testing at that time.    CANCER SCREENING RECOMMENDATIONS:  [name] test result is considered negative (normal).  This means that we have not identified a hereditary cause for her personal history of polyps and family history of cancer at this time.   An individual's cancer risk and medical management are not determined by genetic test results alone. Overall cancer risk assessment incorporates additional factors, including personal medical history, family history, and any available genetic information that may result in a personalized plan for cancer prevention and surveillance. Therefore, it is recommended she continue to follow the cancer management and screening guidelines provided by her  primary healthcare provider.  This negative genetic test simply tells [place] that we cannot yet define why [name] has had  an increased number of colorectal polyps.  [name] medical management and screening should be based on the prospect that she will likely form more colon polyps and should, therefore, undergo more frequent colonoscopy screening at intervals determined by her GI providers.   RECOMMENDATIONS FOR FAMILY MEMBERS:   Since she did not inherit a identifiable mutation in a cancer predisposition gene included on this panel, her children could not have inherited a known mutation from her in one of these genes. Individuals in this family might be at some increased risk of developing cancer, over the general population risk, due to the family history of cancer.  Individuals in the family should notify their providers of the family history of cancer. We recommend women in this family have a yearly mammogram beginning at age 47, or 53 years younger than the earliest onset of cancer, an annual clinical breast exam, and perform monthly breast self-exams.  Family members should have  colonoscopies by at age 34, or earlier, as recommended by their providers.  FOLLOW-UP:  Lastly, we discussed with [name] that cancer genetics is a rapidly advancing field and it is possible that new genetic tests will be appropriate for her and/or her family members in the future. We encouraged her to remain in contact with cancer genetics on an annual basis so we can update her personal and family histories and let her know of advances in cancer genetics that may benefit this family.   Our contact number was provided. [name] questions were answered to her satisfaction, and she knows she is welcome to call us at anytime with additional questions or concerns.    Faith Rogue, MS, [facility] Genetic Counselor [email or web address] Phone: [phone]

## 2022-03-11 ENCOUNTER — Encounter: Payer: Self-pay | Admitting: Licensed Clinical Social Worker

## 2022-03-31 ENCOUNTER — Ambulatory Visit: Payer: Medicare Other | Admitting: Anesthesiology

## 2022-03-31 ENCOUNTER — Ambulatory Visit
Admission: RE | Admit: 2022-03-31 | Discharge: 2022-03-31 | Disposition: A | Discharge status: Home / Self Care | Payer: Medicare Other | Source: Ambulatory Visit | Attending: Gastroenterology | Admitting: Gastroenterology

## 2022-03-31 ENCOUNTER — Encounter
Admission: RE | Disposition: A | Discharge status: Home / Self Care | Payer: Self-pay | Source: Ambulatory Visit | Attending: Gastroenterology

## 2022-03-31 DIAGNOSIS — F419 Anxiety disorder, unspecified: Secondary | ICD-10-CM | POA: Insufficient documentation

## 2022-03-31 DIAGNOSIS — D649 Anemia, unspecified: Secondary | ICD-10-CM | POA: Diagnosis not present

## 2022-03-31 DIAGNOSIS — Z1211 Encounter for screening for malignant neoplasm of colon: Secondary | ICD-10-CM | POA: Insufficient documentation

## 2022-03-31 DIAGNOSIS — I10 Essential (primary) hypertension: Secondary | ICD-10-CM | POA: Diagnosis not present

## 2022-03-31 DIAGNOSIS — E1151 Type 2 diabetes mellitus with diabetic peripheral angiopathy without gangrene: Secondary | ICD-10-CM | POA: Insufficient documentation

## 2022-03-31 DIAGNOSIS — D123 Benign neoplasm of transverse colon: Secondary | ICD-10-CM | POA: Diagnosis not present

## 2022-03-31 DIAGNOSIS — Z86010 Personal history of colonic polyps: Secondary | ICD-10-CM | POA: Insufficient documentation

## 2022-03-31 DIAGNOSIS — K640 First degree hemorrhoids: Secondary | ICD-10-CM | POA: Insufficient documentation

## 2022-03-31 DIAGNOSIS — Z7985 Long-term (current) use of injectable non-insulin antidiabetic drugs: Secondary | ICD-10-CM | POA: Diagnosis not present

## 2022-03-31 DIAGNOSIS — F32A Depression, unspecified: Secondary | ICD-10-CM | POA: Insufficient documentation

## 2022-03-31 DIAGNOSIS — Z7984 Long term (current) use of oral hypoglycemic drugs: Secondary | ICD-10-CM | POA: Insufficient documentation

## 2022-03-31 DIAGNOSIS — G4730 Sleep apnea, unspecified: Secondary | ICD-10-CM | POA: Insufficient documentation

## 2022-03-31 DIAGNOSIS — D125 Benign neoplasm of sigmoid colon: Secondary | ICD-10-CM | POA: Diagnosis not present

## 2022-03-31 DIAGNOSIS — K219 Gastro-esophageal reflux disease without esophagitis: Secondary | ICD-10-CM | POA: Insufficient documentation

## 2022-03-31 DIAGNOSIS — M1990 Unspecified osteoarthritis, unspecified site: Secondary | ICD-10-CM | POA: Diagnosis not present

## 2022-03-31 DIAGNOSIS — Z794 Long term (current) use of insulin: Secondary | ICD-10-CM | POA: Diagnosis not present

## 2022-03-31 HISTORY — PX: COLONOSCOPY WITH PROPOFOL: SHX5780

## 2022-03-31 LAB — GLUCOSE, CAPILLARY: Glucose-Capillary: 275 mg/dL — ABNORMAL HIGH (ref 70–99)

## 2022-03-31 SURGERY — COLONOSCOPY WITH PROPOFOL
Anesthesia: General

## 2022-03-31 MED ORDER — PROPOFOL 500 MG/50ML IV EMUL
INTRAVENOUS | Status: DC | PRN
  Administered 2022-03-31: 150 ug/kg/min via INTRAVENOUS

## 2022-03-31 MED ORDER — PROPOFOL 10 MG/ML IV BOLUS
INTRAVENOUS | Status: DC | PRN
  Administered 2022-03-31: 90 mg via INTRAVENOUS

## 2022-03-31 MED ORDER — LIDOCAINE HCL (CARDIAC) PF 100 MG/5ML IV SOSY
PREFILLED_SYRINGE | INTRAVENOUS | Status: DC | PRN
  Administered 2022-03-31: 50 mg via INTRAVENOUS

## 2022-03-31 MED ORDER — SODIUM CHLORIDE 0.9 % IV SOLN
INTRAVENOUS | Status: DC
  Administered 2022-03-31: 20 mL/h via INTRAVENOUS

## 2022-03-31 NOTE — Transfer of Care (Signed)
Immediate Anesthesia Transfer of Care Note  Patient: Monica Rice  Procedure(s) Performed: COLONOSCOPY WITH PROPOFOL  Patient Location: Endoscopy Unit  Anesthesia Type:General  Level of Consciousness: drowsy  Airway & Oxygen Therapy: Patient Spontanous Breathing and Patient connected to nasal cannula oxygen  Post-op Assessment: Report given to RN and Post -op Vital signs reviewed and stable  Post vital signs: Reviewed and stable  Last Vitals:  Vitals Value Taken Time  BP 95/48 03/31/22 0944  Temp    Pulse 84 03/31/22 0944  Resp 17 03/31/22 0944  SpO2 99 % 03/31/22 0944  Vitals shown include unvalidated device data.  Last Pain:  Vitals:   03/31/22 0840  TempSrc: Temporal  PainSc: 0-No pain         Complications: No notable events documented.

## 2022-03-31 NOTE — Anesthesia Procedure Notes (Signed)
Date/Time: 03/31/2022 9:08 AM  Performed by: Johnna Acosta, CRNAPre-anesthesia Checklist: Patient identified, Emergency Drugs available, Suction available, Patient being monitored and Timeout performed Patient Re-evaluated:Patient Re-evaluated prior to induction Oxygen Delivery Method: Nasal cannula Preoxygenation: Pre-oxygenation with 100% oxygen Induction Type: IV induction

## 2022-03-31 NOTE — Op Note (Addendum)
[facility] Gastroenterology Patient Name: Monica Rice Procedure Date: 03/31/2022 8:54 AM MRN: [id number] Account #: [id number] Date of Birth: 06/17/1950 Admit Type: Outpatient Age: 71 Room: [facility] ENDO ROOM 3 Gender: Female Note Status: Finalized Instrument Name: Jasper Riling [id number] Procedure:             Colonoscopy Indications:           Surveillance: History of numerous (> 10) adenomas on                         last colonoscopy (< 3 yrs) Providers:             Andrey Farmer MD, MD Medicines:             Monitored Anesthesia Care Complications:         No immediate complications. Estimated blood loss:                         Minimal. Procedure:             Pre-Anesthesia Assessment:                        - Prior to the procedure, a History and Physical was                         performed, and patient medications and allergies were                         reviewed. The patient is competent. The risks and                         benefits of the procedure and the sedation options and                         risks were discussed with the patient. All questions                         were answered and informed consent was obtained.                         Patient identification and proposed procedure were                         verified by the physician, the nurse, the                         anesthesiologist, the anesthetist and the technician                         in the endoscopy suite. Mental Status Examination:                         alert and oriented. Airway Examination: normal                         oropharyngeal airway and neck mobility. Respiratory                         Examination: clear to auscultation. CV Examination:  normal. Prophylactic Antibiotics: The patient does not                         require prophylactic antibiotics. Prior                         Anticoagulants: The patient has taken no  anticoagulant                         or antiplatelet agents. ASA Grade Assessment: III - A                         patient with severe systemic disease. After reviewing                         the risks and benefits, the patient was deemed in                         satisfactory condition to undergo the procedure. The                         anesthesia plan was to use monitored anesthesia care                         (MAC). Immediately prior to administration of                         medications, the patient was re-assessed for adequacy                         to receive sedatives. The heart rate, respiratory                         rate, oxygen saturations, blood pressure, adequacy of                         pulmonary ventilation, and response to care were                         monitored throughout the procedure. The physical                         status of the patient was re-assessed after the                         procedure.                        After obtaining informed consent, the colonoscope was                         passed under direct vision. Throughout the procedure,                         the patient's blood pressure, pulse, and oxygen                         saturations were monitored continuously. The  Colonoscope was introduced through the anus and                         advanced to the the cecum, identified by appendiceal                         orifice and ileocecal valve. The colonoscopy was                         somewhat difficult due to significant looping.                         Successful completion of the procedure was aided by                         applying abdominal pressure. The patient tolerated the                         procedure well. The quality of the bowel preparation                         was good. The ileocecal valve, appendiceal orifice,                         and rectum were photographed. Findings:      The  perianal and digital rectal examinations were normal.      A 2 mm polyp was found in the hepatic flexure. The polyp was sessile.       The polyp was removed with a jumbo cold forceps. Resection and retrieval       were complete. Estimated blood loss was minimal.      A 4 mm polyp was found in the hepatic flexure. The polyp was sessile.       The polyp was removed with a cold snare. Resection and retrieval were       complete. Estimated blood loss was minimal.      A 2 mm polyp was found in the transverse colon. The polyp was sessile.       The polyp was removed with a cold snare. Resection and retrieval were       complete. Estimated blood loss was minimal.      A 6 mm polyp was found in the sigmoid colon. The polyp was sessile. The       polyp was removed with a cold snare. Resection and retrieval were       complete. Estimated blood loss was minimal.      Internal hemorrhoids were found during retroflexion. The hemorrhoids       were Grade I (internal hemorrhoids that do not prolapse).      The exam was otherwise without abnormality on direct and retroflexion       views. Impression:            - One 2 mm polyp at the hepatic flexure, removed with                         a jumbo cold forceps. Resected and retrieved.                        - One 4 mm polyp at the  hepatic flexure, removed with                         a cold snare. Resected and retrieved.                        - One 2 mm polyp in the transverse colon, removed with                         a cold snare. Resected and retrieved.                        - One 6 mm polyp in the sigmoid colon, removed with a                         cold snare. Resected and retrieved.                        - Internal hemorrhoids.                        - The examination was otherwise normal on direct and                         retroflexion views. Recommendation:        - Discharge patient to home.                        - Resume previous  diet.                        - Continue present medications.                        - Await pathology results.                        - Repeat colonoscopy in 3 years for surveillance.                        - Return to referring physician as previously                         scheduled. Procedure Code(s):     --- Professional ---                        [phone], Colonoscopy, flexible; with removal of                         tumor(s), polyp(s), or other lesion(s) by snare                         technique                        75170, 19, Colonoscopy, flexible; with biopsy, single                         or multiple Diagnosis Code(s):     --- Professional ---  Z86.010, Personal history of colonic polyps                        D12.3, Benign neoplasm of transverse colon (hepatic                         flexure or splenic flexure)                        D12.5, Benign neoplasm of sigmoid colon                        K64.0, First degree hemorrhoids CPT copyright 2022 American Medical Association. All rights reserved. The codes documented in this report are preliminary and upon coder review may  be revised to meet current compliance requirements. Andrey Farmer MD, MD 03/31/2022 9:45:01 AM Number of Addenda: 0 Note Initiated On: 03/31/2022 8:54 AM Scope Withdrawal Time: 0 hours 14 minutes 41 seconds  Total Procedure Duration: 0 hours 28 minutes 23 seconds  Estimated Blood Loss:  Estimated blood loss was minimal.      [facility]

## 2022-03-31 NOTE — Interval H&P Note (Signed)
History and Physical Interval Note:  03/31/2022 9:08 AM  Monica Rice  has presented today for surgery, with the diagnosis of HX OF ADENOMATOUS POLYP OF COLON.  The various methods of treatment have been discussed with the patient and family. After consideration of risks, benefits and other options for treatment, the patient has consented to  Procedure(s) with comments: COLONOSCOPY WITH PROPOFOL (N/A) - DM as a surgical intervention.  The patient's history has been reviewed, patient examined, no change in status, stable for surgery.  I have reviewed the patient's chart and labs.  Questions were answered to the patient's satisfaction.     Lesly Rubenstein  Ok to proceed with colonoscopy

## 2022-03-31 NOTE — Anesthesia Postprocedure Evaluation (Signed)
Anesthesia Post Note  Patient: Monica Rice  Procedure(s) Performed: COLONOSCOPY WITH PROPOFOL  Patient location during evaluation: Endoscopy Anesthesia Type: General Level of consciousness: awake and alert Pain management: pain level controlled Vital Signs Assessment: post-procedure vital signs reviewed and stable Respiratory status: spontaneous breathing, nonlabored ventilation and respiratory function stable Cardiovascular status: blood pressure returned to baseline and stable Postop Assessment: no apparent nausea or vomiting Anesthetic complications: no   No notable events documented.   Last Vitals:  Vitals:   03/31/22 0943 03/31/22 0959  BP: 110/60 132/70  Pulse:    Resp: 17   Temp:  (!) 36 C  SpO2:      Last Pain:  Vitals:   03/31/22 0959  TempSrc: Temporal  PainSc: 0-No pain                 [place] Ouch

## 2022-03-31 NOTE — H&P (Signed)
Outpatient short stay form Pre-procedure 03/31/2022  Lesly Rubenstein, MD  Primary Physician: Tracie Harrier, MD  Reason for visit:  Personal history of polyps  History of present illness:    71 y/o lady with history of hypertension, DM II, and depression here for colonoscopy for history of 10 polyps on colonoscopy last year. No blood thinners. No family history of GI malignancies. History of meckels diverticulum surgery, cholecystectomy, and hernia repair.    Current Facility-Administered Medications:    0.9 %  sodium chloride infusion, , Intravenous, Continuous, Marica Trentham, Hilton Cork, MD, Last Rate: 20 mL/hr at 03/31/22 0902, 20 mL/hr at 03/31/22 0902  Medications Prior to Admission  Medication Sig Dispense Refill Last Dose   ACCU-CHEK AVIVA PLUS test strip    03/30/2022   amLODipine (NORVASC) 5 MG tablet Take 5 mg by mouth daily.    Past Week   aspirin 81 MG EC tablet Take 81 mg by mouth at bedtime. Swallow whole.    Past Week   buPROPion (WELLBUTRIN XL) 150 MG 24 hr tablet Take 150 mg by mouth at bedtime.   1 Past Week   Calcium Carb-Cholecalciferol (CALCIUM 600 + D PO) Take 1 tablet by mouth daily.    Past Week   clonazePAM (KLONOPIN) 0.5 MG tablet Take 0.5 mg by mouth daily as needed for anxiety.    Past Week   Dulaglutide 0.75 MG/0.5ML SOPN Inject 0.75 mg into the skin every Monday.    Past Week   ferrous sulfate 325 (65 FE) MG tablet Take 325 mg by mouth daily.    Past Week   FLUoxetine (PROZAC) 20 MG capsule Take 40 mg by mouth daily.    Past Week   gabapentin (NEURONTIN) 100 MG capsule Take 100 mg by mouth at bedtime.  3 Past Week   glucose blood test strip Use 3 (three) times daily. Use as instructed.   03/30/2022   hydrochlorothiazide (HYDRODIURIL) 25 MG tablet Take 25 mg by mouth daily.    Past Week   insulin NPH-regular Human (NOVOLIN 70/30) (70-30) 100 UNIT/ML injection Inject 20-70 Units into the skin 2 (two) times daily before a meal. Take 20 units in the morning  and 60 units in the evening.   03/30/2022   metFORMIN (GLUCOPHAGE) 1000 MG tablet Take 1,000 mg by mouth 2 (two) times daily.    Past Week   Multiple Vitamin (MULTIVITAMIN WITH MINERALS) TABS tablet Take 1 tablet by mouth daily. Centrum Silver   Past Week   omeprazole (PRILOSEC) 20 MG capsule Take 20 mg by mouth 2 (two) times daily.    Past Week   vitamin B-12 (CYANOCOBALAMIN) 1000 MCG tablet Take 1,000 mcg by mouth daily.  11 Past Week   zinc gluconate 50 MG tablet Take 50 mg by mouth daily.   Past Week   latanoprost (XALATAN) 0.005 % ophthalmic solution Place 1 drop into both eyes at bedtime.  (Patient not taking: Reported on 06/23/2021)      Magnesium 250 MG TABS Take by mouth daily. (Patient not taking: Reported on 06/23/2021)      mometasone (ELOCON) 0.1 % lotion Apply 2-3 drops topically daily as needed (for ear itching due to dermatitis). (Patient not taking: Reported on 06/23/2021)        Allergies  Allergen Reactions   Hydrocodone Other (See Comments)    Hallucinations   Septra [Sulfamethoxazole-Trimethoprim] Other (See Comments)    Chest pain, headache and rash   Neomycin-Bacitracin-Polymyxin [Bacitracin-Neomycin-Polymyxin] Rash and Other (See Comments)  Increases infection.   Sulfa Antibiotics Rash and Other (See Comments)    Chest pains, headache and rash     Past Medical History:  Diagnosis Date   Abdominal hernia    repaired   Anemia    Anxiety    Cancer ([place])    skin ca   Cervicalgia    Chronic tension headaches    Complication of anesthesia    "difficult to wake up"   Cortical senile cataract    DDD (degenerative disc disease), lumbar    DDD (degenerative disc disease), lumbar    Depression    Diabetes mellitus without complication ([place])    Difficult intubation    Fatty liver    GERD (gastroesophageal reflux disease)    Glaucoma    H/O scarlet fever    Hyperlipidemia    Hypersomnia with sleep apnea    Hypertension    Lumbago    Osteoarthrosis     Peripheral vascular disease (HCC)    Seasonal allergies    Sleep apnea     Review of systems:  Otherwise negative.    Physical Exam  Gen: Alert, oriented. Appears stated age.  HEENT: PERRLA. Lungs: No respiratory distress CV: RRR Abd: soft, benign, no masses Ext: No edema    Planned procedures: Proceed with colonoscopy. The patient understands the nature of the planned procedure, indications, risks, alternatives and potential complications including but not limited to bleeding, infection, perforation, damage to internal organs and possible oversedation/side effects from anesthesia. The patient agrees and gives consent to proceed.  Please refer to procedure notes for findings, recommendations and patient disposition/instructions.     Lesly Rubenstein, MD [facility]

## 2022-03-31 NOTE — Anesthesia Preprocedure Evaluation (Addendum)
Anesthesia Evaluation  Patient identified by MRN, date of birth, ID band Patient awake  General Assessment Comment:Documented challenging airway - prior Grade 3 view with Mcgrath 3 blade  Reviewed: Allergy & Precautions, NPO status , Patient's Chart, lab work & pertinent test results  History of Anesthesia Complications (+) DIFFICULT AIRWAY, PROLONGED EMERGENCE and history of anesthetic complications  Airway Mallampati: III  TM Distance: <3 FB Neck ROM: Full    Dental no notable dental hx. (+) Teeth Intact   Pulmonary sleep apnea and Continuous Positive Airway Pressure Ventilation , neg COPD,    breath sounds clear to auscultation- rhonchi (-) wheezing      Cardiovascular Exercise Tolerance: Good hypertension, Pt. on medications + Peripheral Vascular Disease  (-) CAD, (-) Past MI, (-) Cardiac Stents and (-) CABG  Rhythm:Regular Rate:Normal - Systolic murmurs and - Diastolic murmurs    Neuro/Psych  Headaches, neg Seizures PSYCHIATRIC DISORDERS Anxiety Depression    GI/Hepatic Neg liver ROS, GERD  Controlled and Medicated,  Endo/Other  diabetes, Insulin Dependent  Renal/GU negative Renal ROS     Musculoskeletal  (+) Arthritis ,   Abdominal Normal abdominal exam  (+)   Peds  Hematology  (+) Blood dyscrasia, anemia ,   Anesthesia Other Findings Past Medical History: No date: Anemia No date: Anxiety No date: Cancer [facility])     Comment:  skin ca No date: Cervicalgia No date: Complication of anesthesia     Comment:  "difficult to wake up" No date: DDD (degenerative disc disease), lumbar No date: DDD (degenerative disc disease), lumbar No date: Depression No date: Diabetes mellitus without complication (HCC) No date: Difficult intubation No date: Fatty liver No date: GERD (gastroesophageal reflux disease) No date: Glaucoma No date: H/O scarlet fever No date: Hyperlipidemia No date: Hypersomnia with sleep apnea No  date: Hypertension No date: Peripheral vascular disease (HCC) No date: Seasonal allergies No date: Sleep apnea   Reproductive/Obstetrics                            Anesthesia Physical  Anesthesia Plan  ASA: 3  Anesthesia Plan: General   Post-op Pain Management: Minimal or no pain anticipated   Induction: Intravenous  PONV Risk Score and Plan: 3 and TIVA, Propofol infusion and Treatment may vary due to age or medical condition  Airway Management Planned: Natural Airway  Additional Equipment: None  Intra-op Plan:   Post-operative Plan:   Informed Consent: I have reviewed the patients History and Physical, chart, labs and discussed the procedure including the risks, benefits and alternatives for the proposed anesthesia with the patient or authorized representative who has indicated his/her understanding and acceptance.     Dental advisory given  Plan Discussed with: CRNA and Anesthesiologist  Anesthesia Plan Comments: (Discussed risks of anesthesia with patient, including possibility of difficulty with spontaneous ventilation under anesthesia necessitating airway intervention, PONV, and rare risks such as cardiac or respiratory or neurological events, and allergic reactions. Patient understands.)       Anesthesia Quick Evaluation

## 2022-04-01 ENCOUNTER — Encounter: Payer: Self-pay | Admitting: Gastroenterology

## 2022-04-01 LAB — SURGICAL PATHOLOGY

## 2022-06-16 ENCOUNTER — Other Ambulatory Visit: Payer: Self-pay | Admitting: Internal Medicine

## 2022-06-16 DIAGNOSIS — R0982 Postnasal drip: Secondary | ICD-10-CM

## 2022-06-16 DIAGNOSIS — R053 Chronic cough: Secondary | ICD-10-CM

## 2022-06-16 DIAGNOSIS — I10 Essential (primary) hypertension: Secondary | ICD-10-CM

## 2022-06-16 DIAGNOSIS — E1165 Type 2 diabetes mellitus with hyperglycemia: Secondary | ICD-10-CM

## 2022-06-25 ENCOUNTER — Ambulatory Visit
Admission: RE | Admit: 2022-06-25 | Discharge: 2022-06-25 | Disposition: A | Discharge status: Home / Self Care | Payer: Medicare Other | Source: Ambulatory Visit | Attending: Internal Medicine | Admitting: Internal Medicine

## 2022-06-25 DIAGNOSIS — R053 Chronic cough: Secondary | ICD-10-CM

## 2022-06-25 DIAGNOSIS — Z794 Long term (current) use of insulin: Secondary | ICD-10-CM

## 2022-06-25 DIAGNOSIS — R0982 Postnasal drip: Secondary | ICD-10-CM

## 2022-06-25 DIAGNOSIS — E1165 Type 2 diabetes mellitus with hyperglycemia: Secondary | ICD-10-CM | POA: Diagnosis present

## 2022-06-25 DIAGNOSIS — I10 Essential (primary) hypertension: Secondary | ICD-10-CM

## 2022-06-25 LAB — POCT I-STAT CREATININE: Creatinine, Ser: 0.7 mg/dL (ref 0.44–1.00)

## 2022-06-25 MED ORDER — IOHEXOL 300 MG/ML  SOLN
75.0000 mL | Freq: Once | INTRAMUSCULAR | Status: AC | PRN
  Administered 2022-06-25: 75 mL via INTRAVENOUS

## 2022-07-21 ENCOUNTER — Other Ambulatory Visit: Payer: Self-pay

## 2022-07-21 DIAGNOSIS — Z1231 Encounter for screening mammogram for malignant neoplasm of breast: Secondary | ICD-10-CM

## 2022-08-25 ENCOUNTER — Ambulatory Visit
Admission: RE | Admit: 2022-08-25 | Discharge: 2022-08-25 | Disposition: A | Discharge status: Home / Self Care | Payer: Medicare Other | Source: Ambulatory Visit | Attending: Internal Medicine | Admitting: Internal Medicine

## 2022-08-25 DIAGNOSIS — Z1231 Encounter for screening mammogram for malignant neoplasm of breast: Secondary | ICD-10-CM | POA: Insufficient documentation

## 2022-11-21 ENCOUNTER — Other Ambulatory Visit: Payer: Self-pay | Admitting: Internal Medicine

## 2022-11-21 DIAGNOSIS — R911 Solitary pulmonary nodule: Secondary | ICD-10-CM

## 2022-11-24 IMAGING — MG MM DIGITAL SCREENING BILAT W/ TOMO AND CAD
8 series · 8 of 24 positions shown · non-contrast
Comparison: Previous exam(s).

CLINICAL DATA: Screening.

EXAM:
DIGITAL SCREENING BILATERAL MAMMOGRAM WITH TOMOSYNTHESIS AND CAD
TECHNIQUE: Bilateral screening digital craniocaudal and mediolateral oblique
mammograms were obtained. Bilateral screening digital breast
tomosynthesis was performed. The images were evaluated with
computer-aided detection.

[R CC synth-2D]
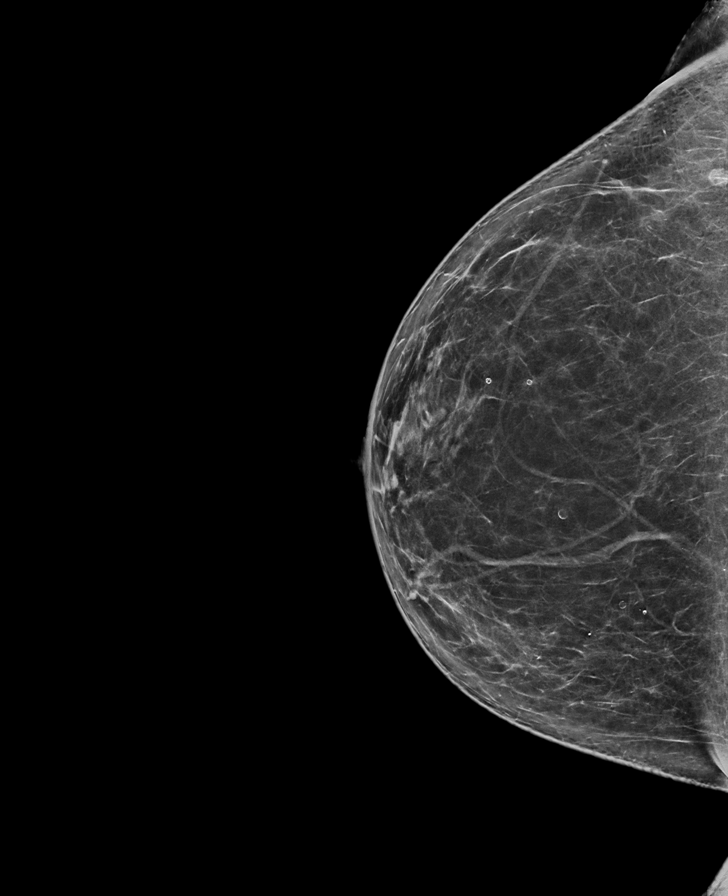

[L CC synth-2D]
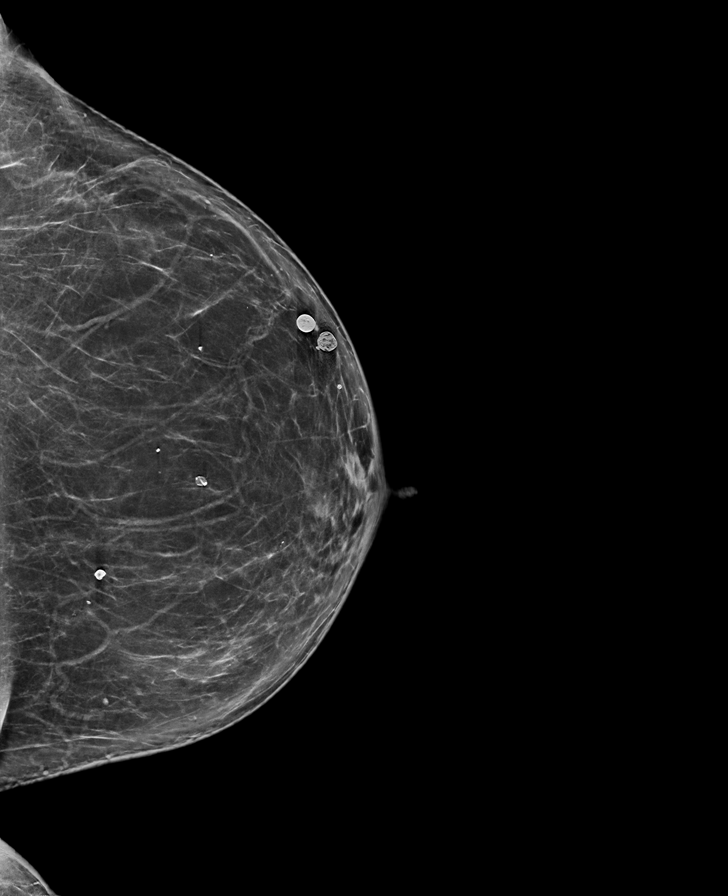

[R MLO synth-2D]
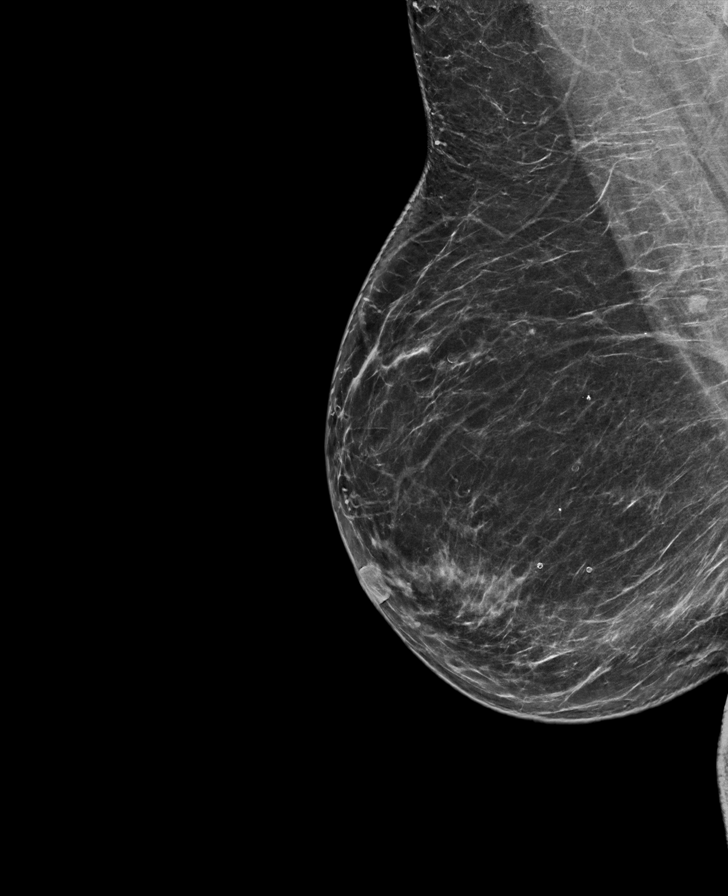

[L MLO synth-2D]
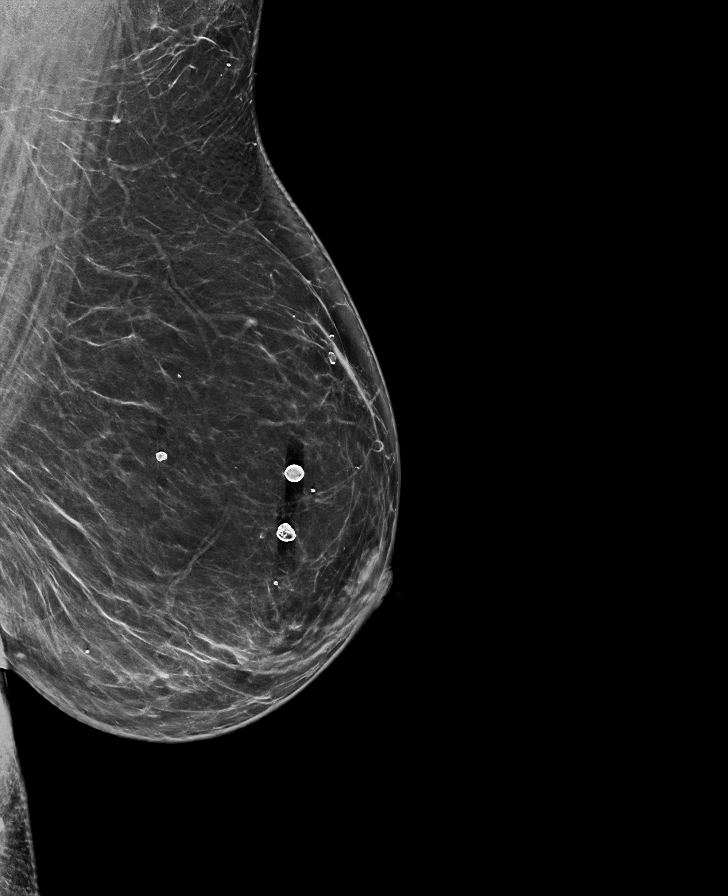

[R MLO tomo · tomo slice 43/85.0]
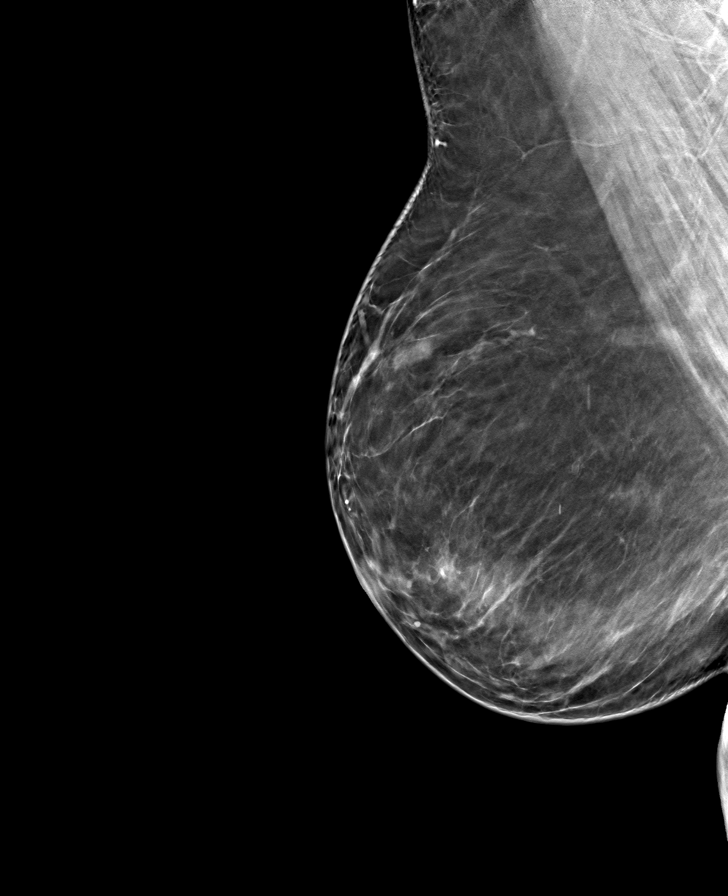

[L MLO tomo · tomo slice 43/85.0]
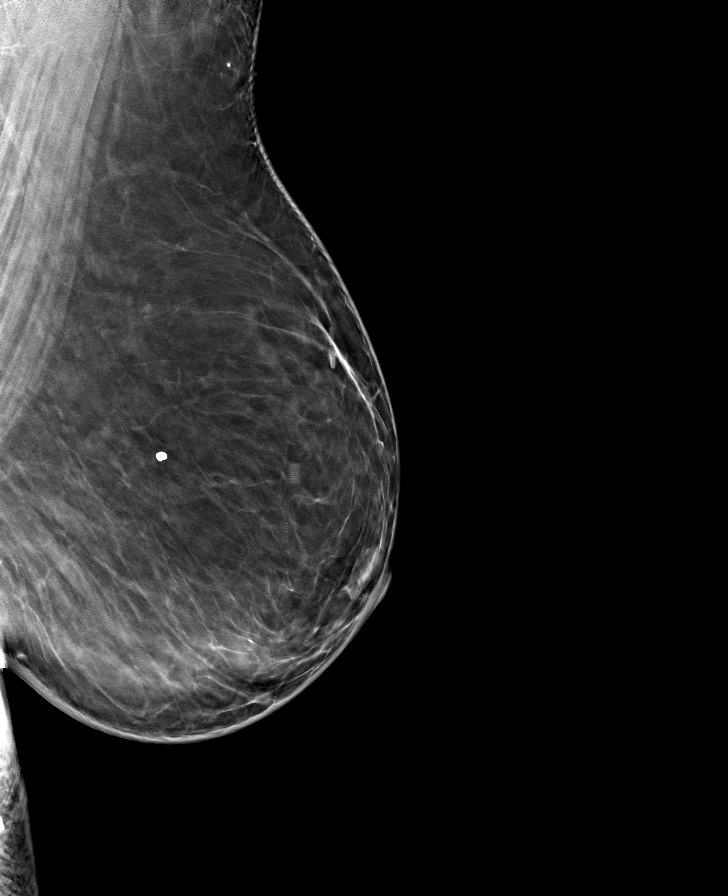

[R CC tomo · tomo slice 41/80.0]
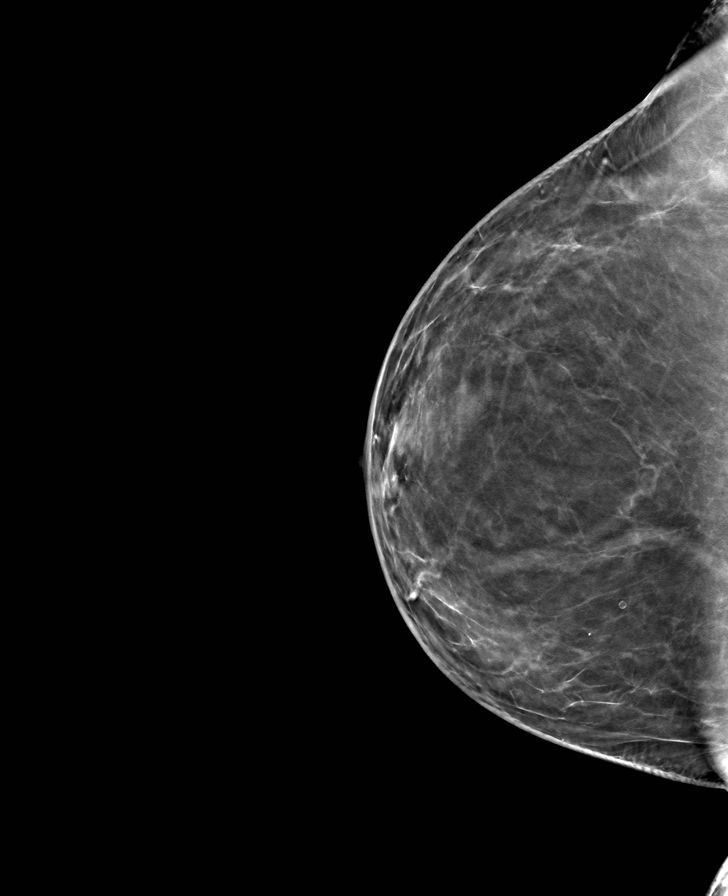

[L CC tomo · tomo slice 41/80.0]
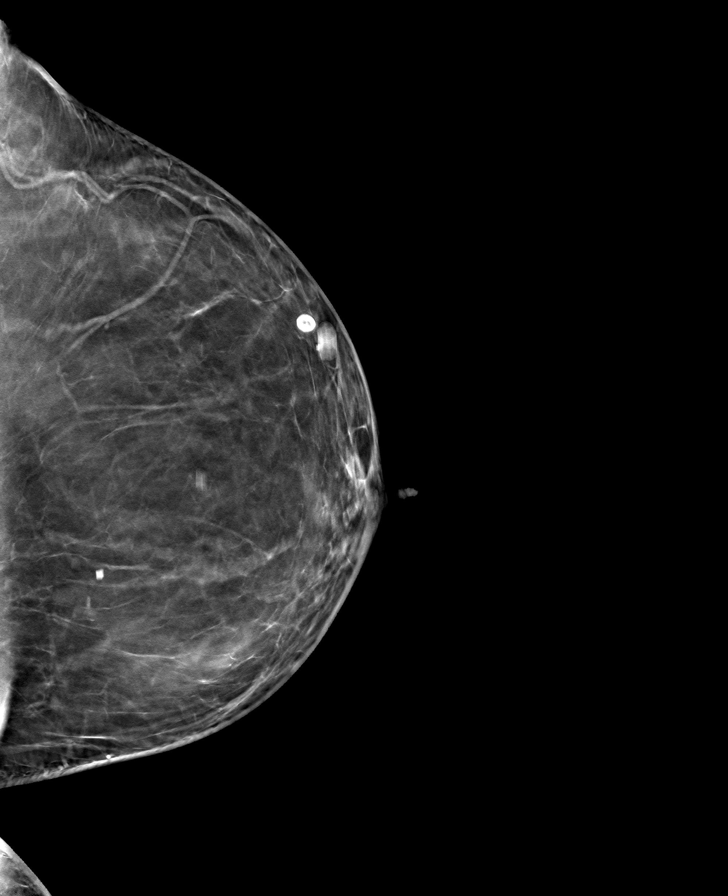

[8 of 24 positions shown; findings below may reference images not displayed]

ACR Breast Density Category b: There are scattered areas of
fibroglandular density.
FINDINGS: There are no findings suspicious for malignancy.
IMPRESSION: No mammographic evidence of malignancy. A result letter of this
screening mammogram will be mailed directly to the patient.

RECOMMENDATION:
Screening mammogram in one year. (Code:51-O-LD2)

BI-RADS CATEGORY  1: Negative.

## 2022-12-17 ENCOUNTER — Ambulatory Visit
Admission: RE | Admit: 2022-12-17 | Discharge: 2022-12-17 | Disposition: A | Discharge status: Home / Self Care | Payer: Medicare Other | Source: Ambulatory Visit | Attending: Internal Medicine | Admitting: Internal Medicine

## 2022-12-17 DIAGNOSIS — R911 Solitary pulmonary nodule: Secondary | ICD-10-CM | POA: Insufficient documentation

## 2022-12-31 ENCOUNTER — Other Ambulatory Visit: Payer: Medicare Other

## 2023-06-19 ENCOUNTER — Other Ambulatory Visit: Payer: Self-pay | Admitting: Internal Medicine

## 2023-06-19 DIAGNOSIS — Z1231 Encounter for screening mammogram for malignant neoplasm of breast: Secondary | ICD-10-CM

## 2023-08-26 ENCOUNTER — Ambulatory Visit
Admission: RE | Admit: 2023-08-26 | Discharge: 2023-08-26 | Disposition: A | Discharge status: Home / Self Care | Payer: Medicare Other | Source: Ambulatory Visit | Attending: Internal Medicine | Admitting: Internal Medicine

## 2023-08-26 DIAGNOSIS — Z1231 Encounter for screening mammogram for malignant neoplasm of breast: Secondary | ICD-10-CM | POA: Insufficient documentation

## 2024-06-17 ENCOUNTER — Other Ambulatory Visit: Payer: Self-pay

## 2024-06-17 DIAGNOSIS — E11649 Type 2 diabetes mellitus with hypoglycemia without coma: Secondary | ICD-10-CM

## 2024-06-23 ENCOUNTER — Ambulatory Visit

## 2024-06-24 ENCOUNTER — Ambulatory Visit: Admission: RE | Admit: 2024-06-24 | Discharge: 2024-06-24 | Disposition: A | Source: Ambulatory Visit

## 2024-06-24 DIAGNOSIS — E11649 Type 2 diabetes mellitus with hypoglycemia without coma: Secondary | ICD-10-CM | POA: Diagnosis present

## 2024-06-24 DIAGNOSIS — Z794 Long term (current) use of insulin: Secondary | ICD-10-CM | POA: Insufficient documentation
